# Patient Record
Sex: Female | Born: 1970
Health system: Southern US, Community
[De-identification: ages and names within clinical notes are randomized; demographics above are authoritative.]

## PROBLEM LIST (undated history)

## (undated) DIAGNOSIS — K219 Gastro-esophageal reflux disease without esophagitis: Secondary | ICD-10-CM

## (undated) DIAGNOSIS — J302 Other seasonal allergic rhinitis: Secondary | ICD-10-CM

## (undated) DIAGNOSIS — R202 Paresthesia of skin: Secondary | ICD-10-CM

## (undated) DIAGNOSIS — M549 Dorsalgia, unspecified: Secondary | ICD-10-CM

## (undated) HISTORY — DX: Gastro-esophageal reflux disease without esophagitis: K21.9

## (undated) HISTORY — PX: OTHER SURGICAL HISTORY: SHX169

## (undated) HISTORY — DX: Dorsalgia, unspecified: M54.9

## (undated) HISTORY — DX: Paresthesia of skin: R20.2

## (undated) HISTORY — DX: Other seasonal allergic rhinitis: J30.2

---

## 2003-04-26 ENCOUNTER — Other Ambulatory Visit: Admission: RE | Admit: 2003-04-26 | Discharge: 2003-04-26 | Payer: Self-pay | Admitting: Gynecology

## 2004-06-15 ENCOUNTER — Other Ambulatory Visit: Admission: RE | Admit: 2004-06-15 | Discharge: 2004-06-15 | Payer: Self-pay | Admitting: Obstetrics and Gynecology

## 2004-11-04 ENCOUNTER — Ambulatory Visit: Payer: Self-pay | Admitting: Internal Medicine

## 2004-12-01 ENCOUNTER — Ambulatory Visit: Payer: Self-pay | Admitting: Internal Medicine

## 2005-01-04 ENCOUNTER — Ambulatory Visit: Payer: Self-pay | Admitting: Internal Medicine

## 2005-01-20 ENCOUNTER — Ambulatory Visit: Payer: Self-pay | Admitting: Internal Medicine

## 2005-02-12 ENCOUNTER — Ambulatory Visit: Payer: Self-pay | Admitting: Internal Medicine

## 2005-06-04 ENCOUNTER — Other Ambulatory Visit: Admission: RE | Admit: 2005-06-04 | Discharge: 2005-06-04 | Payer: Self-pay | Admitting: Obstetrics and Gynecology

## 2005-06-07 ENCOUNTER — Ambulatory Visit: Payer: Self-pay | Admitting: Internal Medicine

## 2005-10-27 ENCOUNTER — Ambulatory Visit: Payer: Self-pay | Admitting: Internal Medicine

## 2006-01-03 ENCOUNTER — Ambulatory Visit: Payer: Self-pay | Admitting: Internal Medicine

## 2006-04-26 ENCOUNTER — Ambulatory Visit: Payer: Self-pay | Admitting: Internal Medicine

## 2007-09-19 LAB — CONVERTED CEMR LAB: Pap Smear: NORMAL

## 2008-07-30 ENCOUNTER — Ambulatory Visit: Payer: Self-pay | Admitting: *Deleted

## 2008-07-30 DIAGNOSIS — J309 Allergic rhinitis, unspecified: Secondary | ICD-10-CM | POA: Insufficient documentation

## 2008-07-30 DIAGNOSIS — K219 Gastro-esophageal reflux disease without esophagitis: Secondary | ICD-10-CM

## 2008-07-30 DIAGNOSIS — J45909 Unspecified asthma, uncomplicated: Secondary | ICD-10-CM | POA: Insufficient documentation

## 2008-08-01 ENCOUNTER — Ambulatory Visit: Payer: Self-pay | Admitting: *Deleted

## 2008-08-01 ENCOUNTER — Telehealth (INDEPENDENT_AMBULATORY_CARE_PROVIDER_SITE_OTHER): Payer: Self-pay | Admitting: *Deleted

## 2008-10-10 ENCOUNTER — Telehealth (INDEPENDENT_AMBULATORY_CARE_PROVIDER_SITE_OTHER): Payer: Self-pay | Admitting: *Deleted

## 2009-03-14 ENCOUNTER — Ambulatory Visit: Payer: Self-pay | Admitting: Internal Medicine

## 2009-04-04 ENCOUNTER — Ambulatory Visit: Payer: Self-pay | Admitting: Internal Medicine

## 2009-04-28 ENCOUNTER — Encounter: Payer: Self-pay | Admitting: Internal Medicine

## 2009-09-22 ENCOUNTER — Ambulatory Visit: Payer: Self-pay | Admitting: Family Medicine

## 2009-09-24 ENCOUNTER — Ambulatory Visit: Payer: Self-pay | Admitting: Family Medicine

## 2009-09-25 ENCOUNTER — Encounter: Payer: Self-pay | Admitting: Family Medicine

## 2009-09-26 ENCOUNTER — Encounter: Payer: Self-pay | Admitting: Family Medicine

## 2009-09-30 ENCOUNTER — Encounter: Payer: Self-pay | Admitting: Family Medicine

## 2009-09-30 LAB — CONVERTED CEMR LAB
ALT: 13 units/L (ref 0–35)
AST: 36 units/L (ref 0–37)
Albumin: 4.1 g/dL (ref 3.5–5.2)
Alkaline Phosphatase: 28 units/L — ABNORMAL LOW (ref 39–117)
BUN: 21 mg/dL (ref 6–23)
Basophils Absolute: 0 10*3/uL (ref 0.0–0.1)
Basophils Relative: 0.7 % (ref 0.0–3.0)
Bilirubin, Direct: 0 mg/dL (ref 0.0–0.3)
CO2: 25 meq/L (ref 19–32)
Calcium: 8.8 mg/dL (ref 8.4–10.5)
Chloride: 96 meq/L (ref 96–112)
Cholesterol: 164 mg/dL (ref 0–200)
Creatinine, Ser: 0.8 mg/dL (ref 0.4–1.2)
Eosinophils Absolute: 0.3 10*3/uL (ref 0.0–0.7)
Eosinophils Relative: 3.7 % (ref 0.0–5.0)
GFR calc non Af Amer: 86.17 mL/min (ref >60)
Glucose, Bld: 88 mg/dL (ref 70–99)
HCT: 39.6 % (ref 36.0–46.0)
HDL: 61.9 mg/dL (ref >39.00)
Hemoglobin: 13.6 g/dL (ref 12.0–15.0)
LDL Cholesterol: 88 mg/dL (ref 0–99)
Lymphocytes Relative: 31.7 % (ref 12.0–46.0)
Lymphs Abs: 2.3 10*3/uL (ref 0.7–4.0)
MCHC: 34.3 g/dL (ref 30.0–36.0)
MCV: 94.2 fL (ref 78.0–100.0)
Monocytes Absolute: 0.7 10*3/uL (ref 0.1–1.0)
Monocytes Relative: 10.4 % (ref 3.0–12.0)
Neutro Abs: 3.8 10*3/uL (ref 1.4–7.7)
Neutrophils Relative %: 53.5 % (ref 43.0–77.0)
Platelets: 295 10*3/uL (ref 150.0–400.0)
Potassium: 3.8 meq/L (ref 3.5–5.1)
RBC: 4.2 M/uL (ref 3.87–5.11)
RDW: 11.6 % (ref 11.5–14.6)
Sodium: 133 meq/L — ABNORMAL LOW (ref 135–145)
TSH: 0.81 microintl units/mL (ref 0.35–5.50)
Total Bilirubin: 0.7 mg/dL (ref 0.3–1.2)
Total CHOL/HDL Ratio: 3
Total Protein: 7.1 g/dL (ref 6.0–8.3)
Triglycerides: 70 mg/dL (ref 0.0–149.0)
VLDL: 14 mg/dL (ref 0.0–40.0)
WBC: 7.1 10*3/uL (ref 4.5–10.5)

## 2010-01-21 ENCOUNTER — Ambulatory Visit: Payer: Self-pay | Admitting: Family

## 2010-01-26 ENCOUNTER — Telehealth: Payer: Self-pay | Admitting: Family

## 2010-01-29 ENCOUNTER — Telehealth (INDEPENDENT_AMBULATORY_CARE_PROVIDER_SITE_OTHER): Payer: Self-pay | Admitting: *Deleted

## 2010-02-04 ENCOUNTER — Encounter: Payer: Self-pay | Admitting: Internal Medicine

## 2010-04-03 ENCOUNTER — Ambulatory Visit: Payer: Self-pay | Admitting: Internal Medicine

## 2010-08-17 ENCOUNTER — Ambulatory Visit: Payer: Self-pay | Admitting: Internal Medicine

## 2010-08-17 DIAGNOSIS — R21 Rash and other nonspecific skin eruption: Secondary | ICD-10-CM

## 2010-08-19 ENCOUNTER — Telehealth (INDEPENDENT_AMBULATORY_CARE_PROVIDER_SITE_OTHER): Payer: Self-pay | Admitting: *Deleted

## 2010-12-24 ENCOUNTER — Inpatient Hospital Stay (HOSPITAL_COMMUNITY)
Admission: AD | Admit: 2010-12-24 | Discharge: 2010-12-27 | Payer: Self-pay | Source: Home / Self Care | Attending: Obstetrics and Gynecology | Admitting: Obstetrics and Gynecology

## 2010-12-24 LAB — RPR: RPR Ser Ql: NONREACTIVE

## 2010-12-24 LAB — CBC
HCT: 38.4 % (ref 36.0–46.0)
Hemoglobin: 13.5 g/dL (ref 12.0–15.0)
MCH: 31.8 pg (ref 26.0–34.0)
MCHC: 35.2 g/dL (ref 30.0–36.0)
MCV: 90.4 fL (ref 78.0–100.0)
Platelets: 258 10*3/uL (ref 150–400)
RBC: 4.25 MIL/uL (ref 3.87–5.11)
RDW: 12.5 % (ref 11.5–15.5)
WBC: 16.2 10*3/uL — ABNORMAL HIGH (ref 4.0–10.5)

## 2011-01-04 LAB — CBC
HCT: 33.3 % — ABNORMAL LOW (ref 36.0–46.0)
Hemoglobin: 11.4 g/dL — ABNORMAL LOW (ref 12.0–15.0)
MCH: 31.6 pg (ref 26.0–34.0)
MCHC: 34.2 g/dL (ref 30.0–36.0)
MCV: 92.2 fL (ref 78.0–100.0)
Platelets: 220 10*3/uL (ref 150–400)
RBC: 3.61 MIL/uL — ABNORMAL LOW (ref 3.87–5.11)
RDW: 12.7 % (ref 11.5–15.5)
WBC: 11.9 10*3/uL — ABNORMAL HIGH (ref 4.0–10.5)

## 2011-01-19 NOTE — Progress Notes (Signed)
Summary: wants referral  Phone Note Call from Patient Call back at Home Phone [phone]   Caller: Patient Summary of Call: pt called this AM, rash is no better still has itching and wetness, and hard to sleep. she is trying to keep area dry.  Pt would like a referral to specialist. She is aware NP is out of office today, and will address tomorrow .Kandice Hams  January 29, 2010 9:07 AM  Initial call taken by: Kandice Hams,  January 29, 2010 9:07 AM  Follow-up for Phone Call        Pls call ms wulff and let her know that I am referring her to [facility].  Thanks Follow-up by: Lemont Fillers FNP,  January 30, 2010 8:40 AM  Additional Follow-up for Phone Call Additional follow up Details #1::        Pt has been informed being referred to Dermatology .Kandice Hams  January 30, 2010 9:36 AM  Additional Follow-up by: Kandice Hams,  January 30, 2010 9:36 AM

## 2011-01-19 NOTE — Consult Note (Signed)
Summary: Nita Sells MD Dermatology  Nita Sells MD Dermatology   Imported By: Lanelle Bal 03/09/2010 08:10:11  _____________________________________________________________________  External Attachment:    Type:   Image     Comment:   External Document

## 2011-01-19 NOTE — Progress Notes (Signed)
Summary: rash-2nd call  Phone Note Call from Patient Call back at [facility] Phone [phone]   Details for Reason: Patient left message on VM: Summary of Call: Patient was treated with Nystatin cream on 01/21/10.  Rash is not improving Shary Decamp  January 26, 2010 9:24 AM   Follow-up for Phone Call        Please call patient and ask her to switch to nystatin powder- it may take up to 2 weeks or her symptoms to resolve.  She should come in to be seen if she develops increased pain, swelling/redness or drainage from the area, or if symptoms are not completely resolved in 2 weeks. Follow-up by: Lemont Fillers FNP,  January 26, 2010 12:26 PM  Additional Follow-up for Phone Call Additional follow up Details #1::        Patient called back.  Needs something called in asap.  Please call patient when this is done. Additional Follow-up by: Barnie Mort,  January 26, 2010 3:57 PM    Additional Follow-up for Phone Call Additional follow up Details #2::    Called patient, no answer, left message on machine notifying patient that I have called in a new prescription.  Left instructions for patient to call if her symptoms worsen or if they are not resolved in 2 weeks. Follow-up by: Lemont Fillers FNP,  January 26, 2010 4:43 PM  New/Updated Medications: NYSTATIN  POWD (NYSTATIN) apply three times a day to affected area Prescriptions: NYSTATIN  POWD (NYSTATIN) apply three times a day to affected area  #1 x 0   Entered and Authorized by:   Lemont Fillers FNP   Signed by:   Lemont Fillers FNP on 01/26/2010   Method used:   Electronically to        [facility] [phone]* (retail)       [address]       [place], [place]  [address]       Ph: [phone]       Fax: [phone]   RxID:   [id number]

## 2011-01-19 NOTE — Assessment & Plan Note (Signed)
Summary: ov/swh   Vital Signs:  Patient profile:   40 year old female Height:      69 inches Weight:      147 pounds Pulse rate:   70 / minute BP sitting:   132 / 70  Vitals Entered By: Shary Decamp (April 03, 2010 3:37 PM) CC: discuss irregular menstrual periods   History of Present Illness: 10 days before her last period she spotted which is highly unusual.  This was follow-up by a normal period. Her gynecologist suggested to take 10 days of progesterone but she wonders if she needs to do that.  She feels fine, off birth control because she likes to start a family  Current Medications (verified): 1)  Advair Diskus 250-50 Mcg/dose  Misc (Fluticasone-Salmeterol) .Marland Kitchen.. 1 Puff Twice Daily (Not Using Often) 2)  Proair Hfa 108 (90 Base) Mcg/act Aers (Albuterol Sulfate) .... As Needed 3)  Tylenol .... Prn 4)  Claritin 10 Mg Caps (Loratadine) .Marland Kitchen.. 1 By Mouth Once Daily 5)  Flonase 50 Mcg/act Susp (Fluticasone Propionate) .... 2 Sprays On Each Side of The Nose  Once Daily  Allergies (verified): No Known Drug Allergies  Past History:  Past Medical History: Reviewed history from 03/14/2009 and no changes required. not on BC G0 Asthma GERD seasonal allergies  Past Surgical History: Reviewed history from 07/30/2008 and no changes required. Denies surgical history  Physical Exam  General:  alert and well-developed.   Psych:  Oriented X3, memory intact for recent and remote, normally interactive, good eye contact, not anxious appearing, and not depressed appearing.     Impression & Recommendations:  Problem # 1:  IRREGULAR MENSES (ICD-626.4) her last menstrual period was abnormal in the sense that was preceded by 10 days of spotting she feels fine, no cramps or further abnormality. Recommend observation if further problems, she is to see her gynecologist  Complete Medication List: 1)  Advair Diskus 250-50 Mcg/dose Misc (Fluticasone-salmeterol) .Marland Kitchen.. 1 puff twice daily (not  using often) 2)  Proair Hfa 108 (90 Base) Mcg/act Aers (Albuterol sulfate) .... As needed 3)  Tylenol  .... Prn 4)  Claritin 10 Mg Caps (Loratadine) .Marland Kitchen.. 1 by mouth once daily 5)  Flonase 50 Mcg/act Susp (Fluticasone propionate) .... 2 sprays on each side of the nose  once daily

## 2011-01-19 NOTE — Assessment & Plan Note (Signed)
Summary: rash all over body/kn   Vital Signs:  Patient profile:   40 year old female Weight:      159.25 pounds Temp:     97.5 degrees F oral Pulse rate:   72 / minute Pulse rhythm:   regular BP sitting:   116 / 76  (left arm) Cuff size:   regular  Vitals Entered By: Army Fossa CMA (August 17, 2010 1:53 PM)  CC: Rash all over body Comments On pts stomach, legs, arms Itches very bad Using benadrly Pt is pregnant   History of Present Illness: patient is 5 months pregnant She developed poison ivy last week, her gynecologist prescribed hydrocortisone cream 1% over-the-counter. That particular rash is getting better  3 days ago she developed an itchy rash initially in the chest, breast, abdomen. The rash then spread to the back, neck and extremities The rash in the legs is a slightly better today  ROS  She feels well Denies any fatigue, fever, joint aches. no N-V-D no ST She's not taking any new medicines no tick bites  Current Medications (verified): 1)  Advair Diskus 250-50 Mcg/dose  Misc (Fluticasone-Salmeterol) .Marland Kitchen.. 1 Puff Twice Daily (Not Using Often) 2)  Proair Hfa 108 (90 Base) Mcg/act Aers (Albuterol Sulfate) .... As Needed  Allergies (verified): No Known Drug Allergies  Past History:  Past Medical History: G1 Asthma GERD seasonal allergies  Past Surgical History: Reviewed history from 07/30/2008 and no changes required. Denies surgical history  Social History: Reviewed history from 03/14/2009 and no changes required. Occupation: Runner, broadcasting/film/video married  from [place]   Physical Exam  General:  alert and well-developed.  looks well Mouth:  no red or D/C  Neck:  no LAD Extremities:  no pretibial edema bilaterally  Skin:  she has a macular slightly pink rash  in the  abdomen =chest = back > breast > arm >leg  no scales, no blisters Axillary Nodes:  neg   Impression & Recommendations:  Problem # 1:  EXANTHEM (ICD-782.1)  5 month pregnant  female with 3 day history of exanthema No systemic symptoms Unclear etiology Viral? Plan: Throat culture Continue with Benadryl p.r.n. Will ask dermatology to evaluate the patient, suggestions? Asked patient to call me if anything changed (fever, arthralgias)  Orders: Dermatology Referral (Derma) T-Culture, Throat [phone])  Complete Medication List: 1)  Advair Diskus 250-50 Mcg/dose Misc (Fluticasone-salmeterol) .Marland Kitchen.. 1 puff twice daily (not using often) 2)  Proair Hfa 108 (90 Base) Mcg/act Aers (Albuterol sulfate) .... As needed 3)  O37fa (dha) + Pnv

## 2011-01-19 NOTE — Progress Notes (Signed)
Summary: Pt status  ---- Converted from flag ---- ---- 08/17/2010 6:47 PM, Jose E. Paz MD wrote: please check on the patient, better? ------------------------------  Left message for pt to call back. Army Fossa CMA  August 19, 2010 11:12 AM  Left message for pt to call back. Army Fossa CMA  August 20, 2010 11:02 AM  Pt states that she is doing better. The derm told her it would probably take up to 2 weeks for her to be a 100% better. Army Fossa CMA  August 21, 2010 10:12 AM

## 2011-01-19 NOTE — Assessment & Plan Note (Signed)
Summary: rash- jr   Vital Signs:  Patient profile:   40 year old female Height:      69 inches Weight:      148.2 pounds BMI:     21.96 Temp:     98.5 degrees F oral BP sitting:   114 / 64  (left arm)  Vitals Entered By: Doristine Devoid (January 21, 2010 10:54 AM) CC: rash x2 wks using otc cream w/o improvement lots of itching   Primary Care Provider:  Dr.  Drue Novel  CC:  rash x2 wks using otc cream w/o improvement lots of itching.  History of Present Illness: Ms Kaitlyn Gallagher is a 40 year old female who presents today with c/o rash in gluteal fold.  Started two weeks ago and is itching/pink and wet.  Used desitin without relief.  Now has placed cotton to avoid rubbing.   Also request refills on her inhalers- reports that her asthma is well controlled, rarely needed rescue inhaler.     Allergies: No Known Drug Allergies  Physical Exam  General:  Well-developed,well-nourished,in no acute distress; alert,appropriate and cooperative throughout examination Skin:  1" area pink skin at top of gluteal fold in crease- no associated induration or drainage   Impression & Recommendations:  Problem # 1:  INTERTRIGO, CANDIDAL (NWG-956.21) Assessment New Will treat with nystatin cream.  Patient instructed to keep that area clean and dry.  Call if symptoms not resolved in 1-2 weeks.  Problem # 2:  ASTHMA (ICD-493.90) Assessment: Improved Continue current treatment Her updated medication list for this problem includes:    Advair Diskus 250-50 Mcg/dose Misc (Fluticasone-salmeterol) .Marland Kitchen... 1 puff twice daily (not using often)    Proair Hfa 108 (90 Base) Mcg/act Aers (Albuterol sulfate) .Marland Kitchen... As needed  Complete Medication List: 1)  Advair Diskus 250-50 Mcg/dose Misc (Fluticasone-salmeterol) .Marland Kitchen.. 1 puff twice daily (not using often) 2)  Proair Hfa 108 (90 Base) Mcg/act Aers (Albuterol sulfate) .... As needed 3)  Tylenol  .... Prn 4)  Claritin 10 Mg Caps (Loratadine) .Marland Kitchen.. 1 by mouth once daily 5)   Flonase 50 Mcg/act Susp (Fluticasone propionate) .... 2 sprays on each side of the nose  once daily 6)  Nystatin 100000 Unit/gm Crea (Nystatin) .... Apply twice daily to affected area until healed  Patient Instructions: 1)  Call if your rash does not resolve in 2 weeks. Prescriptions: NYSTATIN 100000 UNIT/GM CREA (NYSTATIN) apply twice daily to affected area until healed  #1 x 0   Entered and Authorized by:   Lemont Fillers FNP   Signed by:   Lemont Fillers FNP on 01/21/2010   Method used:   Electronically to        [facility] [phone]* (retail)       [address]       [place], [place]  [address]       Ph: [phone]       Fax: [phone]   RxID:   [id number] PROAIR HFA 108 (90 BASE) MCG/ACT AERS (ALBUTEROL SULFATE) as needed  #1 x 0   Entered and Authorized by:   Lemont Fillers FNP   Signed by:   Lemont Fillers FNP on 01/21/2010   Method used:   Electronically to        [facility] [phone]* (retail)       [address]       [place], [place]  [address]       Ph: [phone]  Fax: [phone]   RxID:   [id number]

## 2011-04-06 ENCOUNTER — Encounter: Payer: Self-pay | Admitting: Internal Medicine

## 2011-04-06 ENCOUNTER — Ambulatory Visit (INDEPENDENT_AMBULATORY_CARE_PROVIDER_SITE_OTHER): Payer: BC Managed Care – PPO | Admitting: Internal Medicine

## 2011-04-06 DIAGNOSIS — M549 Dorsalgia, unspecified: Secondary | ICD-10-CM

## 2011-04-06 NOTE — Assessment & Plan Note (Signed)
Likely musculoskeletal issue. See instructions

## 2011-04-06 NOTE — Patient Instructions (Signed)
Warm compress Tylenol 500mg  1 or 2 tabs every 6 hours as needed Or Motrin 200mg  3 tabs every 6 hours as needed (watch for stomach irritation) Call if no better in few day

## 2011-04-06 NOTE — Progress Notes (Signed)
  Subjective:    Patient ID: Kaitlyn Gallagher, female    DOB: 1971-06-25, 40 y.o.   MRN: [id number]  HPI Developed right-sided low back pain without radiation yesterday. Symptoms started when she was taking her baby from the crib. Has taken Motrin with some relief. Patient has a 21-month-old baby, she is breast-feeding.  Past Medical History  Diagnosis Date  . Asthma   . GERD (gastroesophageal reflux disease)   . Seasonal allergies    No past surgical history on file.  History   Social History  . Marital Status: Married    Spouse Name: N/A    Number of Children: N/A  . Years of Education: N/A   Occupational History  . Teacher    Social History Main Topics  . Smoking status: Never Smoker   . Smokeless tobacco: Never Used  . Alcohol Use: Not on file  . Drug Use: Not on file  . Sexually Active: Not on file   Other Topics Concern  . Not on file   Social History Narrative   From [place]     Review of Systems No fever or chills No lower extremity paresthesias No bladder or bowel incontinence    Objective:   Physical Exam  Constitutional: She is oriented to person, place, and time. She appears well-developed and well-nourished.  Abdominal: Soft. Bowel sounds are normal. She exhibits no distension. There is no tenderness. There is no rebound and no guarding.  Musculoskeletal:       Not tender to palpation on the lower back. Range of motion of the hips and knees is normal No edema  Neurological: She is oriented to person, place, and time. She displays normal reflexes. She exhibits normal muscle tone.       Negative straight leg test          Assessment & Plan:

## 2011-08-06 ENCOUNTER — Ambulatory Visit (HOSPITAL_COMMUNITY)
Admission: RE | Admit: 2011-08-06 | Discharge: 2011-08-06 | Disposition: A | Discharge status: Home / Self Care | Payer: BC Managed Care – PPO | Source: Ambulatory Visit | Attending: Obstetrics & Gynecology | Admitting: Obstetrics & Gynecology

## 2011-08-06 NOTE — Progress Notes (Signed)
Adult Lactation Consultation Outpatient Visit Note  Patient Name: Kaitlyn Gallagher Date of Birth: July 08, 1971 Gestational Age at Delivery: Unknown Type of Delivery:   Breastfeeding History: Frequency of Breastfeeding:  Length of Feeding:  Voids:  Stools:   Supplementing / Method: Pumping:  Type of Pump:   Frequency:  Volume:    Comments:    Consultation Evaluation:  Initial Feeding Assessment: Pre-feed Weight: Post-feed Weight: Amount Transferred: Comments:  Additional Feeding Assessment: Pre-feed Weight: Post-feed Weight: Amount Transferred: Comments:  Additional Feeding Assessment: Pre-feed Weight: Post-feed Weight: Amount Transferred: Comments:  Total Breast milk Transferred this Visit:  Total Supplement Given:   Additional Interventions:   Follow-Up      Kaitlyn Gallagher 08/06/2011, 9:33 AM    Mom here with questions about her pump- 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 States pump works fine for a few minutes then the suction decreases. Mom applied pump and hissing sound noted with each suck. Changed the tubing and no hissing sound noted. Mom states that feels like better, more consistent suction. Has been pumping once a day. Discussed pumping plans at work. No questions at present. To call if no improvement with pump.

## 2011-08-07 ENCOUNTER — Encounter (HOSPITAL_COMMUNITY)
Admission: RE | Admit: 2011-08-07 | Discharge: 2011-08-07 | Disposition: A | Discharge status: Home / Self Care | Payer: BC Managed Care – PPO | Source: Ambulatory Visit | Attending: Obstetrics and Gynecology | Admitting: Obstetrics and Gynecology

## 2011-08-07 DIAGNOSIS — O923 Agalactia: Secondary | ICD-10-CM | POA: Insufficient documentation

## 2011-09-07 ENCOUNTER — Encounter (HOSPITAL_COMMUNITY)
Admission: RE | Admit: 2011-09-07 | Discharge: 2011-09-07 | Disposition: A | Discharge status: Home / Self Care | Payer: BC Managed Care – PPO | Source: Ambulatory Visit | Attending: Obstetrics and Gynecology | Admitting: Obstetrics and Gynecology

## 2011-09-07 DIAGNOSIS — O923 Agalactia: Secondary | ICD-10-CM | POA: Insufficient documentation

## 2011-10-08 ENCOUNTER — Encounter (HOSPITAL_COMMUNITY)
Admission: RE | Admit: 2011-10-08 | Discharge: 2011-10-08 | Disposition: A | Discharge status: Home / Self Care | Payer: BC Managed Care – PPO | Source: Ambulatory Visit | Attending: Obstetrics and Gynecology | Admitting: Obstetrics and Gynecology

## 2011-10-08 DIAGNOSIS — O923 Agalactia: Secondary | ICD-10-CM | POA: Insufficient documentation

## 2011-11-08 ENCOUNTER — Encounter (HOSPITAL_COMMUNITY)
Admission: RE | Admit: 2011-11-08 | Discharge: 2011-11-08 | Disposition: A | Discharge status: Home / Self Care | Payer: BC Managed Care – PPO | Source: Ambulatory Visit | Attending: Obstetrics and Gynecology | Admitting: Obstetrics and Gynecology

## 2011-11-08 DIAGNOSIS — O923 Agalactia: Secondary | ICD-10-CM | POA: Insufficient documentation

## 2011-12-09 ENCOUNTER — Encounter (HOSPITAL_COMMUNITY)
Admission: RE | Admit: 2011-12-09 | Discharge: 2011-12-09 | Disposition: A | Discharge status: Home / Self Care | Payer: BC Managed Care – PPO | Source: Ambulatory Visit | Attending: Obstetrics and Gynecology | Admitting: Obstetrics and Gynecology

## 2011-12-09 DIAGNOSIS — O923 Agalactia: Secondary | ICD-10-CM | POA: Insufficient documentation

## 2012-01-05 ENCOUNTER — Ambulatory Visit: Payer: BC Managed Care – PPO | Admitting: Internal Medicine

## 2012-01-09 ENCOUNTER — Encounter (HOSPITAL_COMMUNITY)
Admission: RE | Admit: 2012-01-09 | Discharge: 2012-01-09 | Disposition: A | Discharge status: Home / Self Care | Payer: BC Managed Care – PPO | Source: Ambulatory Visit | Attending: Obstetrics and Gynecology | Admitting: Obstetrics and Gynecology

## 2012-01-09 DIAGNOSIS — O923 Agalactia: Secondary | ICD-10-CM | POA: Insufficient documentation

## 2012-03-31 ENCOUNTER — Encounter: Payer: Self-pay | Admitting: Internal Medicine

## 2012-03-31 ENCOUNTER — Ambulatory Visit (INDEPENDENT_AMBULATORY_CARE_PROVIDER_SITE_OTHER): Payer: BC Managed Care – PPO | Admitting: Internal Medicine

## 2012-03-31 VITALS — BP 96/64 | HR 75 | Temp 97.9°F | Wt 153.0 lb

## 2012-03-31 DIAGNOSIS — J309 Allergic rhinitis, unspecified: Secondary | ICD-10-CM

## 2012-03-31 DIAGNOSIS — J45909 Unspecified asthma, uncomplicated: Secondary | ICD-10-CM

## 2012-03-31 DIAGNOSIS — R0609 Other forms of dyspnea: Secondary | ICD-10-CM

## 2012-03-31 LAB — CBC WITH DIFFERENTIAL/PLATELET
Basophils Absolute: 0.1 10*3/uL (ref 0.0–0.1)
Basophils Relative: 0.9 % (ref 0.0–3.0)
Eosinophils Absolute: 0.2 10*3/uL (ref 0.0–0.7)
Eosinophils Relative: 3.6 % (ref 0.0–5.0)
HCT: 41.2 % (ref 36.0–46.0)
Hemoglobin: 13.9 g/dL (ref 12.0–15.0)
Lymphocytes Relative: 32.7 % (ref 12.0–46.0)
Lymphs Abs: 2.3 10*3/uL (ref 0.7–4.0)
MCHC: 33.7 g/dL (ref 30.0–36.0)
MCV: 90.9 fl (ref 78.0–100.0)
Monocytes Absolute: 0.6 10*3/uL (ref 0.1–1.0)
Monocytes Relative: 8.4 % (ref 3.0–12.0)
Neutro Abs: 3.7 10*3/uL (ref 1.4–7.7)
Neutrophils Relative %: 54.4 % (ref 43.0–77.0)
Platelets: 321 10*3/uL (ref 150.0–400.0)
RBC: 4.53 Mil/uL (ref 3.87–5.11)
RDW: 12.3 % (ref 11.5–14.6)
WBC: 6.9 10*3/uL (ref 4.5–10.5)

## 2012-03-31 MED ORDER — ALBUTEROL SULFATE HFA 108 (90 BASE) MCG/ACT IN AERS: 2.0000 | INHALATION_SPRAY | Freq: Four times a day (QID) | RESPIRATORY_TRACT | Status: DC | PRN

## 2012-03-31 MED ORDER — CROMOLYN SODIUM 5.2 MG/ACT NA AERS: 1.0000 | INHALATION_SPRAY | Freq: Four times a day (QID) | NASAL | Status: DC

## 2012-03-31 NOTE — Patient Instructions (Addendum)
Claritin 10 mg over-the-counter once a day. Nasal spray as prescribed. Albuterol only as needed if cough, wheezing. Call if no better in few days , call if you get worse

## 2012-03-31 NOTE — Progress Notes (Signed)
  Subjective:    Patient ID: Kaitlyn Gallagher, female    DOB: [birth year], 41 y.o.   MRN: [id number]  HPI Acute visit Here for the management of allergies. Her allergies were well controlled during her pregnancy but have come back in the last few weeks. She has not been taking any medication because  she is currently breast-feeding. Complains of sneezing, itchy eyes and mild cough. Symptoms are worse when she goes outside.  Past Medical History  Diagnosis Date  . Asthma   . GERD (gastroesophageal reflux disease)   . Seasonal allergies       Review of Systems No actual wheezing but sometimes it is "harder to breathe"  at night and with exertion. Denies lower extremity edema or chest pain. She thinks the difficulty breathing is related to allergies.     Objective:   Physical Exam General -- alert, well-developed, and well-nourished. NAD  Neck --no LADs HEENT -- TMs normal, throat w/o redness, face symmetric and not tender to palpation, nose and eyes normal Lungs -- normal respiratory effort, no intercostal retractions, no accessory muscle use, and normal breath sounds.   Heart-- normal rate, regular rhythm, no murmur, and no gallop.   Extremities-- no pretibial edema bilaterally      Assessment & Plan:

## 2012-03-31 NOTE — Assessment & Plan Note (Signed)
Increase in allergy symptoms, occasionally has noted difficulty breathing, on exam the lungs are clear. She has no chest pain. "Difficulty breathing" maybe related to asthma. Avoid  Inhaler steroids for now because she is breast-feeding, albuterol when necessary; treat allergies. Will check a CBC to be sure the difficulty breathing is not related to anemia. Encouraged to call if she gets worse or is not improving

## 2012-03-31 NOTE — Assessment & Plan Note (Signed)
Increased allergy symptoms in the last few weeks, mostly when she goes outside. We'll treat with Claritin  and a cromolyn nasal spray, both are safe during lactation. Will call if no better.

## 2012-04-03 ENCOUNTER — Encounter: Payer: Self-pay | Admitting: *Deleted

## 2012-04-26 ENCOUNTER — Encounter: Payer: BC Managed Care – PPO | Admitting: Internal Medicine

## 2012-10-13 ENCOUNTER — Encounter: Payer: Self-pay | Admitting: Internal Medicine

## 2012-10-13 ENCOUNTER — Ambulatory Visit (INDEPENDENT_AMBULATORY_CARE_PROVIDER_SITE_OTHER): Payer: BC Managed Care – PPO | Admitting: Internal Medicine

## 2012-10-13 VITALS — BP 104/68 | HR 82 | Temp 98.5°F | Wt 157.0 lb

## 2012-10-13 DIAGNOSIS — R432 Parageusia: Secondary | ICD-10-CM

## 2012-10-13 DIAGNOSIS — M79609 Pain in unspecified limb: Secondary | ICD-10-CM

## 2012-10-13 DIAGNOSIS — R439 Unspecified disturbances of smell and taste: Secondary | ICD-10-CM

## 2012-10-13 MED ORDER — PANTOPRAZOLE SODIUM 40 MG PO TBEC: 40.0000 mg | DELAYED_RELEASE_TABLET | Freq: Every day | ORAL | Status: DC

## 2012-10-13 NOTE — Progress Notes (Signed)
  Subjective:    Patient ID: Kaitlyn Gallagher, female    DOB: 1971/07/11, 41 y.o.   MRN: [id number]  HPI Acute visit 2 weeks history of a different taste in her mouth, dry mouth?. Also, from time to time has sharp, short lived pains in different parts: Elbows, fingers, often times also at the distal  left or right foot.  Past Medical History  Diagnosis Date  . Asthma   . GERD (gastroesophageal reflux disease)   . Seasonal allergies       Review of Systems Denies any dry eyes No actual heartburn  not taking any new medicines Denies actual swelling in the joints or any rash Has not increased her physical activity or start a running program, she does have a 69-year-old daughter and does frequent lifting of course.     Objective:   Physical Exam  General -- alert, well-developed, healthy-appearing and well-nourished.   HEENT -- TMs normal, throat w/o redness, no mucosal dryness or lesions. Extremities-- no pretibial edema bilaterally; inspection and palpation of the elbows, wrists, hands and feet normal Neurologic-- alert & oriented X3 and strength normal in all extremities. Psych-- Cognition and judgment appear intact. Alert and cooperative with normal attention span and concentration.  not anxious appearing and not depressed appearing.      Assessment & Plan:   Dysgeusia , ENT examination normal, related to GERD? Trial with Protonix, if not better she will let me know  Pain in multiple locations, Physical exam is normal, feet pain not classic for plantar fasciitis, could be it stress fracture or Morton's neuroma but the pain is on and off on bilaterally which may those  diagnosis less likely.  recommend observation for now

## 2012-10-13 NOTE — Patient Instructions (Addendum)
Take Protonix before breakfast every day for 4 weeks then as needed Call if you continue having difficulty with the taste. Call if  aches and pains get worse or you see swelling @ the joints.

## 2012-10-16 ENCOUNTER — Ambulatory Visit: Payer: BC Managed Care – PPO | Admitting: Internal Medicine

## 2013-01-01 ENCOUNTER — Ambulatory Visit: Payer: BC Managed Care – PPO | Admitting: Internal Medicine

## 2013-01-01 DIAGNOSIS — Z0289 Encounter for other administrative examinations: Secondary | ICD-10-CM

## 2013-08-24 ENCOUNTER — Encounter: Payer: Self-pay | Admitting: Internal Medicine

## 2013-08-24 ENCOUNTER — Ambulatory Visit (HOSPITAL_BASED_OUTPATIENT_CLINIC_OR_DEPARTMENT_OTHER)
Admission: RE | Admit: 2013-08-24 | Discharge: 2013-08-24 | Disposition: A | Discharge status: Home / Self Care | Payer: BC Managed Care – PPO | Source: Ambulatory Visit | Attending: Internal Medicine | Admitting: Internal Medicine

## 2013-08-24 ENCOUNTER — Ambulatory Visit (INDEPENDENT_AMBULATORY_CARE_PROVIDER_SITE_OTHER): Payer: BC Managed Care – PPO | Admitting: Internal Medicine

## 2013-08-24 VITALS — BP 125/76 | HR 71 | Temp 98.5°F | Wt 157.6 lb

## 2013-08-24 DIAGNOSIS — M549 Dorsalgia, unspecified: Secondary | ICD-10-CM

## 2013-08-24 DIAGNOSIS — M545 Low back pain, unspecified: Secondary | ICD-10-CM | POA: Insufficient documentation

## 2013-08-24 DIAGNOSIS — M5137 Other intervertebral disc degeneration, lumbosacral region: Secondary | ICD-10-CM | POA: Insufficient documentation

## 2013-08-24 DIAGNOSIS — M51379 Other intervertebral disc degeneration, lumbosacral region without mention of lumbar back pain or lower extremity pain: Secondary | ICD-10-CM | POA: Insufficient documentation

## 2013-08-24 NOTE — Assessment & Plan Note (Addendum)
Chronic back pain, mild to moderate . Likely a muscle skeletal issue, doubt a sciatic problem or something similar. Plan is to refer her to physical medicine MD for a more detail exam and hopefuly a Rx of a tailored physical therapy plan.  I think if she improves her core muscle strength she will do better. For completeness we'll check a x-ray. Continue Tylenol Motrin.

## 2013-08-24 NOTE — Patient Instructions (Addendum)
Get the XR at [facility], corner of [address] (10 minutes form here); they are open 24/7 [address]  [place], [place] [address] [phone] --- Warm compress as needed Take  Tylenol  500 mg OTC 2 tabs a day every 8 hours as needed for pain or Motrin 200 mg 2 tablets every 6 hours as needed for pain. Always take it with food because may cause gastritis and ulcers. If you notice nausea, stomach pain, change in the color of stools --->  Stop the medicine and let us know

## 2013-08-24 NOTE — Progress Notes (Signed)
  Subjective:    Patient ID: Kaitlyn Gallagher, female    DOB: 1971/03/12, 42 y.o.   MRN: [id number]  HPI Acute visit Approximately 2 years history of back pain, on and off, could be left or right, lower or upper spine. Symptoms are getting increasingly worse, for the last few months she has a pain in the back every day, sx increase thedays after she tries to exercise like for instance after doing Zumba. No radiation. Takes Motrin or Tylenol with partial relief.  Past Medical History  Diagnosis Date  . Asthma   . GERD (gastroesophageal reflux disease)   . Seasonal allergies    Past Surgical History  Procedure Laterality Date  . No past surgeries     History   Social History  . Marital Status: Married    Spouse Name: N/A    Number of Children: 1  . Years of Education: N/A   Occupational History  . Teacher   .     Social History Main Topics  . Smoking status: Never Smoker   . Smokeless tobacco: Never Used  . Alcohol Use: Yes     Comment: rarely   . Drug Use: Not on file  . Sexual Activity: Not on file   Other Topics Concern  . Not on file   Social History Narrative   From [place]           Review of Systems Denies any lower extremity paresthesias. Not on birth control pills, Pertinent x-ray andpatient states she's not pregnant    Objective:   Physical Exam General -- alert, well-developed, NAD.  Back-neck: Not tender to palpation at the cervical, thoracic or lumbar spine.  Extremities-- no pretibial edema bilaterally  Neurologic-- alert & oriented X3. Speech, gait normal. DTRs symmetric, strength normal in all extremities, Straight leg test neg  Psych-- Cognition and judgment appear intact. Alert and cooperative with normal attention span and concentration. not anxious appearing and not depressed appearing.      Assessment & Plan:

## 2013-08-25 ENCOUNTER — Encounter: Payer: Self-pay | Admitting: Internal Medicine

## 2013-08-28 ENCOUNTER — Encounter: Payer: Self-pay | Admitting: *Deleted

## 2013-08-31 ENCOUNTER — Encounter: Payer: Self-pay | Admitting: Physical Medicine & Rehabilitation

## 2013-09-14 ENCOUNTER — Ambulatory Visit (HOSPITAL_BASED_OUTPATIENT_CLINIC_OR_DEPARTMENT_OTHER): Payer: BC Managed Care – PPO | Admitting: Physical Medicine & Rehabilitation

## 2013-09-14 ENCOUNTER — Encounter: Payer: BC Managed Care – PPO | Attending: Physical Medicine & Rehabilitation

## 2013-09-14 ENCOUNTER — Encounter: Payer: Self-pay | Admitting: Physical Medicine & Rehabilitation

## 2013-09-14 VITALS — BP 113/59 | HR 70 | Resp 14 | Ht 69.0 in | Wt 160.4 lb

## 2013-09-14 DIAGNOSIS — M5137 Other intervertebral disc degeneration, lumbosacral region: Secondary | ICD-10-CM | POA: Insufficient documentation

## 2013-09-14 DIAGNOSIS — M51379 Other intervertebral disc degeneration, lumbosacral region without mention of lumbar back pain or lower extremity pain: Secondary | ICD-10-CM | POA: Insufficient documentation

## 2013-09-14 NOTE — Patient Instructions (Addendum)
Please attend physical therapy and follow a home exercise program prescribed by your therapist  Use Tylenol for pain  May use heating pad  May use ICY hot or therma care patch  Recheck in 6 weeks

## 2013-09-14 NOTE — Progress Notes (Signed)
Subjective:    Patient ID: Kaitlyn Gallagher, female    DOB: March 22, 1971, 42 y.o.   MRN: [id number]  HPI Low back pain since post partum 2012, does not complain of any radiating pain into the hips knees or feet. No falls or injury. No pain during pregnancy. She feels like she has gotten weaker and has not been exercising like she used to. Try doing as above but had pain for several days after words in her low back area. Secondary complaint is neck and upper back pain. No numbness or tingling in the hands. No headaches. ,  No PT tried.  No ASA or Ibuprofen but has GERD, has been using Tylenol on an as needed basis  Pt had flare up of back pain after moving furniture. 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 She states that it was while sitting in small chairs. History of asthma, GERD  Pain Inventory Average Pain 8 Pain Right Now 6 My pain is constant, dull and aching  In the last 24 hours, has pain interfered with the following? General activity 3 Relation with others 0 Enjoyment of life 3 What TIME of day is your pain at its worst? morning and night Sleep (in general) Good  Pain is worse with: bending, sitting and some activites Pain improves with: heat/ice and medication Relief from Meds: 6  Mobility walk without assistance how many minutes can you walk? 120 ability to climb steps?  yes do you drive?  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  no EXAM:  LUMBAR SPINE - COMPLETE 4+ VIEW  COMPARISON: None.  FINDINGS:  Disc space narrowing at L5-S1 compatible with mild degenerative disc  disease. Normal alignment. No fracture. SI joints are symmetric and  unremarkable.  IMPRESSION:  Degenerative disc disease L5-S1.  Electronically Signed   By: Charlett Nose M.D.  On: 08/24/2013 16:08   Physicians involved in your care Any changes since last visit?  no Primary care [facility]   No family history on file. History   Social History  . Marital Status: Married    Spouse Name: N/A    Number of Children: 1  . Years of Education: N/A   Occupational History  . Teacher   .     Social History Main Topics  . Smoking status: Never Smoker   . Smokeless tobacco: Never Used  . Alcohol Use: Yes     Comment: rarely   . Drug Use: None  . Sexual Activity: None   Other Topics Concern  . None   Social History Narrative   From [place]         Past Surgical History  Procedure Laterality Date  . No past surgeries     Past Medical History  Diagnosis Date  . Asthma   . GERD (gastroesophageal reflux disease)   . Seasonal allergies    BP 113/59  Pulse 70  Resp 14  Ht 5\' 9"  (1.753 m)  Wt 160 lb 6.4 oz (72.757 kg)  BMI 23.68 kg/m2  SpO2 98%  LMP 08/19/2013    Review of Systems  Musculoskeletal: Positive for back pain.       Objective:   Physical Exam  Nursing note and vitals reviewed. Constitutional: She is  oriented to person, place, and time. She appears well-developed and well-nourished.  HENT:  Head: Normocephalic and atraumatic.  Eyes: Conjunctivae and EOM are normal. Pupils are equal, round, and reactive to light.  Neck: Normal range of motion.  Musculoskeletal:       Right hip: She exhibits normal range of motion.       Left hip: She exhibits normal range of motion.       Lumbar back: She exhibits normal range of motion.  Negative straight leg raise test  Neurological: She is alert and oriented to person, place, and time. She has normal reflexes.  Psychiatric: She has a normal mood and affect.   Negative femoral stretch test Ambulates without toe drag or knee instability. Sensory exam is normal in bilateral lower extremities     Assessment & Plan:  1. Lumbar degenerative disc L5-S1 without  evidence of radiculopathy. She has had increasing pain after becoming relatively deconditioned. Patient now feels that doing her former exercises is to painful.  Recommend a physical therapy to emphasize core strengthening and transition back into a community-based exercise program  We discussed her films and viewed them. I answered questions about her back. We used a spine model to explain the findings on x-rays. I do not see any indication for MRI at the current time. I do not think she needs any injections for low back pain.  She may continue acetaminophen as needed for pain. Not to exceed 1000 mg 4 times per day. In fact she takes this rarely  She may use heat for low back pain 30 minutes at a time. IC[facility] HOT  patches or therma care patches

## 2013-10-02 ENCOUNTER — Ambulatory Visit: Payer: BC Managed Care – PPO | Attending: Physical Medicine & Rehabilitation | Admitting: Physical Therapy

## 2013-10-02 DIAGNOSIS — IMO0001 Reserved for inherently not codable concepts without codable children: Secondary | ICD-10-CM | POA: Insufficient documentation

## 2013-10-02 DIAGNOSIS — M545 Low back pain, unspecified: Secondary | ICD-10-CM | POA: Insufficient documentation

## 2013-10-09 ENCOUNTER — Ambulatory Visit: Payer: BC Managed Care – PPO | Admitting: Physical Therapy

## 2013-10-25 ENCOUNTER — Ambulatory Visit: Payer: BC Managed Care – PPO | Admitting: Physical Therapy

## 2013-10-26 ENCOUNTER — Encounter: Payer: BC Managed Care – PPO | Attending: Physical Medicine & Rehabilitation

## 2013-10-26 ENCOUNTER — Ambulatory Visit: Payer: BC Managed Care – PPO | Admitting: Physical Medicine & Rehabilitation

## 2013-10-26 DIAGNOSIS — M5137 Other intervertebral disc degeneration, lumbosacral region: Secondary | ICD-10-CM | POA: Insufficient documentation

## 2013-10-26 DIAGNOSIS — M51379 Other intervertebral disc degeneration, lumbosacral region without mention of lumbar back pain or lower extremity pain: Secondary | ICD-10-CM | POA: Insufficient documentation

## 2013-10-30 ENCOUNTER — Encounter: Payer: Self-pay | Admitting: Physical Medicine & Rehabilitation

## 2013-10-30 ENCOUNTER — Ambulatory Visit (HOSPITAL_BASED_OUTPATIENT_CLINIC_OR_DEPARTMENT_OTHER): Payer: BC Managed Care – PPO | Admitting: Physical Medicine & Rehabilitation

## 2013-10-30 VITALS — BP 107/47 | HR 76 | Resp 14 | Ht 69.0 in | Wt 156.0 lb

## 2013-10-30 DIAGNOSIS — M51379 Other intervertebral disc degeneration, lumbosacral region without mention of lumbar back pain or lower extremity pain: Secondary | ICD-10-CM

## 2013-10-30 DIAGNOSIS — M5137 Other intervertebral disc degeneration, lumbosacral region: Secondary | ICD-10-CM

## 2013-10-30 DIAGNOSIS — M533 Sacrococcygeal disorders, not elsewhere classified: Secondary | ICD-10-CM

## 2013-10-30 NOTE — Patient Instructions (Signed)
Please asked to or therapist to show you. exercises to help your sacroiliac joint

## 2013-10-30 NOTE — Progress Notes (Signed)
Subjective:    Patient ID: Kaitlyn Gallagher, female    DOB: 21-Oct-1971, 42 y.o.   MRN: [id number]  HPI Low back pain since post partum 2012, does not complain of any radiating pain into the hips knees or feet. No falls or injury. No pain during pregnancy. She feels like she has gotten weaker and has not been exercising like she used to. Try doing as above but had pain for several days after words in her low back area. No further neck pain  PT for 2 visits. Third visit is scheduled today. Has learned her home exercise program. Overall doing better.  has been using Tylenol on an as needed basis   Pain is down to a 1/10. May go a little higher if she bends and twists such as removing groceries from the back seat of the car  Pain Inventory Average Pain 2 Pain Right Now 1 My pain is n/a  In the last 24 hours, has pain interfered with the following? General activity 0 Relation with others 0 Enjoyment of life 0 What TIME of day is your pain at its worst? morning and evening Sleep (in general) Good  Pain is worse with: bending Pain improves with: heat/ice and therapy/exercise Relief from Meds: 9  Mobility Do you have any goals in this area?  no  Function Do you have any goals in this area?  no  Neuro/Psych No problems in this area  Prior Studies Any changes since last visit?  no  Physicians involved in your care Any changes since last visit?  no   History reviewed. No pertinent family history. History   Social History  . Marital Status: Married    Spouse Name: N/A    Number of Children: 1  . Years of Education: N/A   Occupational History  . Teacher   .     Social History Main Topics  . Smoking status: Never Smoker   . Smokeless tobacco: Never Used  . Alcohol Use: Yes     Comment: rarely   . Drug Use: None  . Sexual Activity: None   Other Topics Concern  . None   Social History Narrative   From [place]         Past Surgical History  Procedure Laterality  Date  . No past surgeries     Past Medical History  Diagnosis Date  . Asthma   . GERD (gastroesophageal reflux disease)   . Seasonal allergies    BP 107/47  Pulse 76  Resp 14  Ht 5\' 9"  (1.753 m)  Wt 156 lb (70.761 kg)  BMI 23.03 kg/m2  SpO2 96%  LMP 10/16/2013     Review of Systems  Musculoskeletal: Positive for back pain.  All other systems reviewed and are negative.       Objective:   Physical Exam Nursing note and vitals reviewed.  Constitutional: She is oriented to person, place, and time. She appears well-developed and well-nourished.  HENT:  Head: Normocephalic and atraumatic.  Eyes: Conjunctivae and EOM are normal. Pupils are equal, round, and reactive to light.  Neck: Normal range of motion.  Musculoskeletal:  Right hip: She exhibits normal range of motion.  Left hip: She exhibits normal range of motion.  Lumbar back: She exhibits normal range of motion.  Negative straight leg raise test  Neurological: She is alert and oriented to person, place, and time. She has normal reflexes.  Psychiatric: She has a normal mood and affect.  Mild tenderness right PSIS  Ambulates without toe drag or knee instability.         Assessment & Plan:  1. Lumbar degenerative disc L5-S1 without evidence of radiculopathy. She has had increasing pain after becoming relatively deconditioned. Patient now feels that doing her former exercises is to painful.  Recommend a physical therapy to emphasize core strengthening and transition back into a community-based exercise program  We discussed her films and viewed them. I answered questions about her back. We used a spine model to explain the findings on x-rays.  I do not see any indication for MRI at the current time.  I do not think she needs any injections for low back pain.  She may continue acetaminophen as needed for pain. Not to exceed 1000 mg 4 times per day. In fact she takes this rarely   2. Patient also may have some  sacroiliac involvement on the right side. Will last therapy to address this with exercise  Return to clinic when necessary Discussed the natural progression of degenerative disc disease as well as potential for radiculopathy. She will call with any exacerbation

## 2013-10-31 ENCOUNTER — Ambulatory Visit: Payer: BC Managed Care – PPO | Attending: Physical Medicine & Rehabilitation | Admitting: Physical Therapy

## 2013-10-31 DIAGNOSIS — IMO0001 Reserved for inherently not codable concepts without codable children: Secondary | ICD-10-CM | POA: Insufficient documentation

## 2013-10-31 DIAGNOSIS — M545 Low back pain, unspecified: Secondary | ICD-10-CM | POA: Insufficient documentation

## 2013-11-29 ENCOUNTER — Telehealth: Payer: Self-pay | Admitting: Internal Medicine

## 2013-11-29 NOTE — Telephone Encounter (Signed)
Patient is calling because her husband ([place], Leighton Parody a pt of Dr. Frederik Pear) came in yesterday with symptoms that afterwards sent him to the ER. Patient states that the doctor at the ER says it is a possibility he might have meningitis, although is lab results are not back yet, and advised that she call us to preventative treatment for bacterial meningitis out of precaution. Please advise what patient needs to do.

## 2013-11-29 NOTE — Telephone Encounter (Signed)
Spoke with the patient, she is asymptomatic, the doctor who saw her husband today advised against antibiotic prophylaxis until the results come back. Plan: Droplet precautions, patient to the ER if she has fever or headaches, she will keep me posted

## 2014-10-21 ENCOUNTER — Encounter: Payer: Self-pay | Admitting: Physical Medicine & Rehabilitation

## 2014-11-05 ENCOUNTER — Ambulatory Visit (INDEPENDENT_AMBULATORY_CARE_PROVIDER_SITE_OTHER): Payer: BC Managed Care – PPO

## 2014-11-05 ENCOUNTER — Encounter: Payer: Self-pay | Admitting: Internal Medicine

## 2014-11-05 ENCOUNTER — Ambulatory Visit (INDEPENDENT_AMBULATORY_CARE_PROVIDER_SITE_OTHER): Payer: BC Managed Care – PPO | Admitting: Internal Medicine

## 2014-11-05 VITALS — BP 105/55 | HR 69 | Temp 98.3°F | Wt 153.5 lb

## 2014-11-05 DIAGNOSIS — M545 Low back pain: Secondary | ICD-10-CM

## 2014-11-05 DIAGNOSIS — L989 Disorder of the skin and subcutaneous tissue, unspecified: Secondary | ICD-10-CM

## 2014-11-05 DIAGNOSIS — J4521 Mild intermittent asthma with (acute) exacerbation: Secondary | ICD-10-CM

## 2014-11-05 DIAGNOSIS — Z23 Encounter for immunization: Secondary | ICD-10-CM

## 2014-11-05 MED ORDER — ALBUTEROL SULFATE HFA 108 (90 BASE) MCG/ACT IN AERS: 2.0000 | INHALATION_SPRAY | Freq: Four times a day (QID) | RESPIRATORY_TRACT | Status: DC | PRN

## 2014-11-05 NOTE — Patient Instructions (Signed)
Please call when you need us to set up the acupunturist referral

## 2014-11-05 NOTE — Progress Notes (Signed)
   Subjective:    Patient ID: Kaitlyn Gallagher, female    DOB: [birth year], 43 y.o.   MRN: [id number]  DOS:  11/05/2014 Type of visit - description : here to discuss the following issues On and off back pain, current treatment is yoga and if she has an exacerbation acupuncture. Needs a referral One-year history of skin lesions at the face, would like them treated. Asthma, currently well controlled, needs a refill on albuterol    Past Medical History  Diagnosis Date  . Asthma   . GERD (gastroesophageal reflux disease)   . Seasonal allergies   . Back pain     Past Surgical History  Procedure Laterality Date  . No past surgeries      History   Social History  . Marital Status: Married    Spouse Name: N/A    Number of Children: 1  . Years of Education: N/A   Occupational History  . Teacher   .     Social History Main Topics  . Smoking status: Never Smoker   . Smokeless tobacco: Never Used  . Alcohol Use: Yes     Comment: rarely   . Drug Use: Not on file  . Sexual Activity: Not on file   Other Topics Concern  . Not on file   Social History Narrative   From [place]              Medication List       This list is accurate as of: 11/05/14  5:47 PM.  Always use your most recent med list.               acetaminophen 325 MG tablet  Commonly known as:  TYLENOL  Take 650 mg by mouth every 6 (six) hours as needed for pain (if needed).     albuterol 108 (90 BASE) MCG/ACT inhaler  Commonly known as:  PROAIR HFA  Inhale 2 puffs into the lungs every 6 (six) hours as needed.     ibuprofen 200 MG tablet  Commonly known as:  ADVIL,MOTRIN  Take 200 mg by mouth as needed.           Objective:   Physical Exam  HENT:  Head:     BP 105/55 mmHg  Pulse 69  Temp(Src) 98.3 F (36.8 C) (Oral)  Wt 153 lb 8 oz (69.627 kg)  SpO2 96%  LMP 09/19/2014 General -- alert, well-developed, NAD.   Extremities-- no pretibial edema bilaterally  Neurologic--  alert &  oriented X3. Speech normal, gait appropriate for age, strength symmetric and appropriate for age.    Psych-- Cognition and judgment appear intact. Cooperative with normal attention span and concentration. No anxious or depressed appearing.       Assessment & Plan:    F2F 15 min coordinating her care

## 2014-11-05 NOTE — Assessment & Plan Note (Signed)
Skin lesions, needs a referral to dermatology, will refer to Dr. Margo AyeHall office, has seen Mrs. Amber before

## 2014-11-05 NOTE — Assessment & Plan Note (Signed)
No symptoms in a while, would like a prescription for albuterol just in case, prescription provided. Flu shot provided as well

## 2014-11-05 NOTE — Assessment & Plan Note (Signed)
On and off back pain, well-controlled with yoga, OTCs, she remains very active. Likes  a referral for an acupuncturist on standby by to be used as needed Referral enter

## 2014-11-05 NOTE — Progress Notes (Signed)
Pre visit review using our clinic review tool, if applicable. No additional management support is needed unless otherwise documented below in the visit note. 

## 2015-11-10 ENCOUNTER — Ambulatory Visit: Payer: BLUE CROSS/BLUE SHIELD | Admitting: Internal Medicine

## 2015-11-10 ENCOUNTER — Telehealth: Payer: Self-pay

## 2015-11-10 NOTE — Telephone Encounter (Signed)
No charge this time. 

## 2015-11-10 NOTE — Telephone Encounter (Signed)
pt showed up for her appt but appt was already canceled since pt left message on voice machine,pt stated does NOT want to be charged NO SHOW/

## 2015-11-10 NOTE — Telephone Encounter (Signed)
Spoke with Drue Novelaz and he also gave me the ok NOT to charge NO SHOW.

## 2015-11-19 ENCOUNTER — Ambulatory Visit: Payer: BLUE CROSS/BLUE SHIELD | Admitting: Internal Medicine

## 2016-02-25 ENCOUNTER — Ambulatory Visit (INDEPENDENT_AMBULATORY_CARE_PROVIDER_SITE_OTHER): Payer: BLUE CROSS/BLUE SHIELD | Admitting: Medical

## 2016-02-25 ENCOUNTER — Encounter: Payer: Self-pay | Admitting: Medical

## 2016-02-25 ENCOUNTER — Other Ambulatory Visit: Payer: Self-pay | Admitting: Medical

## 2016-02-25 ENCOUNTER — Ambulatory Visit (HOSPITAL_BASED_OUTPATIENT_CLINIC_OR_DEPARTMENT_OTHER)
Admission: RE | Admit: 2016-02-25 | Discharge: 2016-02-25 | Disposition: A | Discharge status: Home / Self Care | Payer: BLUE CROSS/BLUE SHIELD | Source: Ambulatory Visit | Attending: Medical | Admitting: Medical

## 2016-02-25 VITALS — BP 100/60 | HR 68 | Temp 98.0°F | Ht 69.0 in | Wt 156.7 lb

## 2016-02-25 DIAGNOSIS — J309 Allergic rhinitis, unspecified: Secondary | ICD-10-CM | POA: Diagnosis not present

## 2016-02-25 DIAGNOSIS — N910 Primary amenorrhea: Secondary | ICD-10-CM | POA: Diagnosis not present

## 2016-02-25 DIAGNOSIS — S46819A Strain of other muscles, fascia and tendons at shoulder and upper arm level, unspecified arm, initial encounter: Secondary | ICD-10-CM | POA: Diagnosis not present

## 2016-02-25 DIAGNOSIS — M50322 Other cervical disc degeneration at C5-C6 level: Secondary | ICD-10-CM | POA: Diagnosis not present

## 2016-02-25 DIAGNOSIS — L853 Xerosis cutis: Secondary | ICD-10-CM

## 2016-02-25 DIAGNOSIS — M542 Cervicalgia: Secondary | ICD-10-CM

## 2016-02-25 DIAGNOSIS — J4520 Mild intermittent asthma, uncomplicated: Secondary | ICD-10-CM | POA: Diagnosis not present

## 2016-02-25 DIAGNOSIS — M4802 Spinal stenosis, cervical region: Secondary | ICD-10-CM | POA: Diagnosis not present

## 2016-02-25 DIAGNOSIS — N926 Irregular menstruation, unspecified: Secondary | ICD-10-CM

## 2016-02-25 LAB — POCT URINE PREGNANCY: Preg Test, Ur: NEGATIVE

## 2016-02-25 MED ORDER — ALBUTEROL SULFATE HFA 108 (90 BASE) MCG/ACT IN AERS: 2.0000 | INHALATION_SPRAY | Freq: Four times a day (QID) | RESPIRATORY_TRACT | Status: DC | PRN

## 2016-02-25 MED ORDER — FLUTICASONE PROPIONATE 50 MCG/ACT NA SUSP: 2.0000 | Freq: Every day | NASAL | Status: DC

## 2016-02-25 NOTE — Progress Notes (Signed)
Subjective:    Patient ID: Kaitlyn Gallagher, female    DOB: November 23, 1971, 45 y.o.   MRN: [id number]  HPI    Pt in with some neck pain in upper neck. Pt notices it when driving and sometimes can here crepitus type sound.  Faint discomfort at best. If has to give number 2-3/10.  This has been going on for about a week.  No radiating pain to her arms.  Pt reports maybe some stress.    Pt also states in past has used inhalers. She states in [place] usually in spring will rarely [place] inhalers.Faint sob recently but no wheeze. No leg pain.   Pt has been sneezing and some itching throat. Pt sometimes uses claritin. No nasal sprays.  Occasionally feels dry lower eye lid.    Review of Systems  Constitutional: Negative for fever, chills and fatigue.  HENT: Positive for congestion, sneezing and sore throat.        Only itchy throat.  Respiratory: Negative for cough, chest tightness, shortness of breath and wheezing.   Cardiovascular: Negative for chest pain and palpitations.  Gastrointestinal: Negative for abdominal pain.  Musculoskeletal: Negative for back pain.  Skin:       Mild dry skin to lower eye lids.  Neurological: Negative for dizziness and headaches.  Hematological: Negative for adenopathy. Does not bruise/bleed easily.  Psychiatric/Behavioral: Negative for behavioral problems and confusion.    Past Medical History  Diagnosis Date  . Asthma   . GERD (gastroesophageal reflux disease)   . Seasonal allergies   . Back pain     Social History   Social History  . Marital Status: Married    Spouse Name: N/A  . Number of Children: 1  . Years of Education: N/A   Occupational History  . Teacher   .     Social History Main Topics  . Smoking status: Never Smoker   . Smokeless tobacco: Never Used  . Alcohol Use: Yes     Comment: rarely   . Drug Use: Not on file  . Sexual Activity: Not on file   Other Topics Concern  . Not on file   Social History Narrative   From  [place]          Past Surgical History  Procedure Laterality Date  . No past surgeries      No family history on file.  No Known Allergies  Current Outpatient Prescriptions on File Prior to Visit  Medication Sig Dispense Refill  . acetaminophen (TYLENOL) 325 MG tablet Take 650 mg by mouth every 6 (six) hours as needed for pain (if needed).    Marland Kitchen albuterol (PROAIR HFA) 108 (90 BASE) MCG/ACT inhaler Inhale 2 puffs into the lungs every 6 (six) hours as needed. 1 Inhaler 1  . ibuprofen (ADVIL,MOTRIN) 200 MG tablet Take 200 mg by mouth as needed.     No current facility-administered medications on file prior to visit.    BP 100/60 mmHg  Pulse 68  Temp(Src) 98 F (36.7 C) (Oral)  Ht  (1.753 m)  Wt 156 lb 11.2 oz (71.079 kg)  BMI 23.13 kg/m2  SpO2 97%       Objective:   Physical Exam  General- No acute distress. Pleasant patient. Neck- Full range of motion, no jvd. No c-spine pain faint trapezius tender at insertion at best.   HEENT Head- Normal. Ear Auditory Canal - Left- Normal. Right - Normal.Tympanic Membrane- Left- Normal. Right- Normal. Eye Sclera/Conjunctiva- Left- Normal. Right-  Normal.(but allergic shiner appearance. Faint dry appearance to eye lid) Nose & Sinuses Nasal Mucosa- Left-  Boggy and Congested. Right-  Boggy and  Congested.Bilateral no  maxillary and no  frontal sinus pressure. Mouth & Throat Lips: Upper Lip- Normal: no dryness, cracking, pallor, cyanosis, or vesicular eruption. Lower Lip-Normal: no dryness, cracking, pallor, cyanosis or vesicular eruption. Buccal Mucosa- Bilateral- No Aphthous ulcers. Oropharynx- No Discharge or Erythema. Tonsils: Characteristics- Bilateral- No Erythema or Congestion. Size/Enlargement- Bilateral- No enlargement. Discharge- bilateral-None.  Chest and Lung Exam Auscultation: Breath Sounds:-Clear even and unlabored.  Cardiovascular Auscultation:Rythm- Regular, rate and rhythm. Murmurs & Other Heart  Sounds:Ausculatation of the heart reveal- No Murmurs.  Lymphatic Head & Neck General Head & Neck Lymphatics: Bilateral: Description- No Localized lymphadenopathy.  Neurologic- CNII- XII grossly intact.  Lower ext- no edema, calfs symmetic, negative homans signs.      Assessment & Plan:  For neck pain. Use alleve and trapezius massage. Get cspine xray today. If pain worsens or changes notify us.  For hx of rare wheezing. Refill albuterol  For allergies stay on claritin. Rx flonase.  For dry skin use otc moisturize to lower lids. Could also some try vitamin e to lower lid. But be careflul not to put in lash area.  Follow up in 2 wks or as needed

## 2016-02-25 NOTE — Progress Notes (Signed)
Pre visit review using our clinic review tool, if applicable. No additional management support is needed unless otherwise documented below in the visit note. 

## 2016-02-25 NOTE — Patient Instructions (Addendum)
For neck pain. Use alleve and trapezius massage. Get cspine xray today. If pain worsens or changes notify us.  For hx of rare wheezing. Refill albuterol  For allergies stay on claritin. Rx flonase.  For dry skin use otc moisturize to lower lids. Could also some try vitamin e  Lotion or oil to lower lid. But be careflul not to put in lash area.  Follow up in 2 wks or as needed

## 2017-10-19 DIAGNOSIS — Z6823 Body mass index (BMI) 23.0-23.9, adult: Secondary | ICD-10-CM | POA: Diagnosis not present

## 2017-10-19 DIAGNOSIS — Z1231 Encounter for screening mammogram for malignant neoplasm of breast: Secondary | ICD-10-CM | POA: Diagnosis not present

## 2017-10-19 DIAGNOSIS — Z01419 Encounter for gynecological examination (general) (routine) without abnormal findings: Secondary | ICD-10-CM | POA: Diagnosis not present

## 2017-10-20 LAB — HM PAP SMEAR

## 2017-10-31 DIAGNOSIS — Z1321 Encounter for screening for nutritional disorder: Secondary | ICD-10-CM | POA: Diagnosis not present

## 2017-10-31 DIAGNOSIS — Z13220 Encounter for screening for lipoid disorders: Secondary | ICD-10-CM | POA: Diagnosis not present

## 2017-10-31 DIAGNOSIS — Z131 Encounter for screening for diabetes mellitus: Secondary | ICD-10-CM | POA: Diagnosis not present

## 2017-10-31 DIAGNOSIS — Z1329 Encounter for screening for other suspected endocrine disorder: Secondary | ICD-10-CM | POA: Diagnosis not present

## 2017-11-23 DIAGNOSIS — Z803 Family history of malignant neoplasm of breast: Secondary | ICD-10-CM | POA: Diagnosis not present

## 2017-11-23 DIAGNOSIS — Z800 Family history of malignant neoplasm of digestive organs: Secondary | ICD-10-CM | POA: Diagnosis not present

## 2017-12-20 HISTORY — PX: BREAST BIOPSY: SHX20

## 2018-01-11 DIAGNOSIS — Z809 Family history of malignant neoplasm, unspecified: Secondary | ICD-10-CM | POA: Diagnosis not present

## 2018-01-14 ENCOUNTER — Other Ambulatory Visit: Payer: Self-pay | Admitting: Obstetrics and Gynecology

## 2018-01-14 DIAGNOSIS — Z803 Family history of malignant neoplasm of breast: Secondary | ICD-10-CM

## 2018-01-26 ENCOUNTER — Ambulatory Visit
Admission: RE | Admit: 2018-01-26 | Discharge: 2018-01-26 | Disposition: A | Discharge status: Home / Self Care | Payer: BLUE CROSS/BLUE SHIELD | Source: Ambulatory Visit | Attending: Obstetrics and Gynecology | Admitting: Obstetrics and Gynecology

## 2018-01-26 DIAGNOSIS — Z803 Family history of malignant neoplasm of breast: Secondary | ICD-10-CM

## 2018-01-26 DIAGNOSIS — Z833 Family history of diabetes mellitus: Secondary | ICD-10-CM | POA: Diagnosis not present

## 2018-01-26 MED ORDER — GADOBENATE DIMEGLUMINE 529 MG/ML IV SOLN
14.0000 mL | Freq: Once | INTRAVENOUS | Status: AC | PRN
  Administered 2018-01-26: 14 mL via INTRAVENOUS

## 2018-02-02 ENCOUNTER — Other Ambulatory Visit: Payer: Self-pay | Admitting: Obstetrics and Gynecology

## 2018-02-02 DIAGNOSIS — N63 Unspecified lump in unspecified breast: Secondary | ICD-10-CM

## 2018-02-08 ENCOUNTER — Other Ambulatory Visit: Payer: Self-pay | Admitting: Obstetrics and Gynecology

## 2018-02-08 ENCOUNTER — Ambulatory Visit
Admission: RE | Admit: 2018-02-08 | Discharge: 2018-02-08 | Disposition: A | Discharge status: Home / Self Care | Payer: BLUE CROSS/BLUE SHIELD | Source: Ambulatory Visit | Attending: Obstetrics and Gynecology | Admitting: Obstetrics and Gynecology

## 2018-02-08 DIAGNOSIS — N63 Unspecified lump in unspecified breast: Secondary | ICD-10-CM

## 2018-02-08 DIAGNOSIS — N6313 Unspecified lump in the right breast, lower outer quadrant: Secondary | ICD-10-CM | POA: Diagnosis not present

## 2018-02-08 DIAGNOSIS — D241 Benign neoplasm of right breast: Secondary | ICD-10-CM | POA: Diagnosis not present

## 2018-02-08 DIAGNOSIS — N6314 Unspecified lump in the right breast, lower inner quadrant: Secondary | ICD-10-CM | POA: Diagnosis not present

## 2018-02-13 ENCOUNTER — Telehealth: Payer: Self-pay | Admitting: *Deleted

## 2018-02-13 NOTE — Telephone Encounter (Signed)
Received Dermatopathology Report results from [facility]; forwarded to provider/SLS 02/25    

## 2018-04-17 DIAGNOSIS — M67442 Ganglion, left hand: Secondary | ICD-10-CM | POA: Diagnosis not present

## 2018-04-17 DIAGNOSIS — M25522 Pain in left elbow: Secondary | ICD-10-CM | POA: Diagnosis not present

## 2018-04-17 DIAGNOSIS — M25532 Pain in left wrist: Secondary | ICD-10-CM | POA: Diagnosis not present

## 2018-04-17 DIAGNOSIS — M25521 Pain in right elbow: Secondary | ICD-10-CM | POA: Diagnosis not present

## 2018-04-17 DIAGNOSIS — M7701 Medial epicondylitis, right elbow: Secondary | ICD-10-CM | POA: Diagnosis not present

## 2018-06-26 ENCOUNTER — Encounter: Payer: Self-pay | Admitting: Internal Medicine

## 2018-06-26 ENCOUNTER — Ambulatory Visit (INDEPENDENT_AMBULATORY_CARE_PROVIDER_SITE_OTHER): Payer: BLUE CROSS/BLUE SHIELD | Admitting: Internal Medicine

## 2018-06-26 VITALS — BP 116/74 | HR 84 | Temp 98.1°F | Resp 16 | Ht 69.0 in | Wt 161.4 lb

## 2018-06-26 DIAGNOSIS — Z114 Encounter for screening for human immunodeficiency virus [HIV]: Secondary | ICD-10-CM

## 2018-06-26 DIAGNOSIS — Z23 Encounter for immunization: Secondary | ICD-10-CM | POA: Diagnosis not present

## 2018-06-26 DIAGNOSIS — Z0000 Encounter for general adult medical examination without abnormal findings: Secondary | ICD-10-CM | POA: Diagnosis not present

## 2018-06-26 NOTE — Progress Notes (Signed)
Subjective:    Patient ID: Kaitlyn Gallagher, female    DOB: 1971-04-04, 47 y.o.   MRN: [id number]  DOS:  06/26/2018 Type of visit - description : cpx Interval history: Here for CPX   Review of Systems Has right elbow pain, saw orthopedic surgery approximately 4 months ago, not completely back to normal Has a number of moles that she can check because they  are on her back. Occasional headaches, mild, usually related to her periods or ovulation but sometimes no relationship.  No recent headache has been "the worst of her life".  No associated nausea.   Other than above, a 14 point review of systems is negative      Past Medical History:  Diagnosis Date  . Asthma   . Back pain   . GERD (gastroesophageal reflux disease)   . Seasonal allergies     Past Surgical History:  Procedure Laterality Date  . uterine polyp removal       Social History   Socioeconomic History  . Marital status: Married    Spouse name: Not on file  . Number of children: 1  . Years of education: Not on file  . Highest education level: Not on file  Occupational History  . Occupation: Magazine features editor: [employer]  Social Needs  . Financial resource strain: Not on file  . Food insecurity:    Worry: Not on file    Inability: Not on file  . Transportation needs:    Medical: Not on file    Non-medical: Not on file  Tobacco Use  . Smoking status: Never Smoker  . Smokeless tobacco: Never Used  Substance and Sexual Activity  . Alcohol use: Yes    Comment: rarely   . Drug use: Not on file  . Sexual activity: Not on file  Lifestyle  . Physical activity:    Days per week: Not on file    Minutes per session: Not on file  . Stress: Not on file  Relationships  . Social connections:    Talks on phone: Not on file    Gets together: Not on file    Attends religious service: Not on file    Active member of club or organization: Not on file    Attends meetings of clubs or organizations: Not  on file    Relationship status: Not on file  . Intimate partner violence:    Fear of current or ex partner: Not on file    Emotionally abused: Not on file    Physically abused: Not on file    Forced sexual activity: Not on file  Other Topics Concern  . Not on file  Social History Narrative   From [place]   [place]         Family History  Problem Relation Age of Onset  . Osteoarthritis Mother   . Colon cancer Father        1  . Breast cancer Paternal Aunt   . Breast cancer Paternal Aunt      Allergies as of 06/26/2018      Reactions   Gadolinium Derivatives Hives   Pt complained at end of study about itching on left side at waistline. Only one visible hive, pt was seen by dr Mosetta Putt. He felt since so slight, no benadryl given, pt stayed at office for extra 20 minutes to be checked.      Medication List        Accurate as  of 06/26/18 11:59 PM. Always use your most recent med list.          acetaminophen 325 MG tablet Commonly known as:  TYLENOL Take 650 mg by mouth every 6 (six) hours as needed for pain (if needed).   albuterol 108 (90 Base) MCG/ACT inhaler Commonly known as:  PROAIR HFA Inhale 2 puffs into the lungs every 6 (six) hours as needed.          Objective:   Physical Exam BP 116/74 (BP Location: Left Arm, Patient Position: Sitting, Cuff Size: Small)   Pulse 84   Temp 98.1 F (36.7 C) (Oral)   Resp 16   Ht 5\' 9"  (1.753 m)   Wt 161 lb 6 oz (73.2 kg)   LMP 06/12/2018 (Approximate)   SpO2 98%   BMI 23.83 kg/m  General: Well developed, NAD, see BMI.  Neck: No  thyromegaly  HEENT:  Normocephalic . Face symmetric, atraumatic Lungs:  CTA B Normal respiratory effort, no intercostal retractions, no accessory muscle use. Heart: RRR,  no murmur.  No pretibial edema bilaterally  Abdomen:  Not distended, soft, non-tender. No rebound or rigidity.   Skin: Exposed areas without rash. Not pale. Not jaundice Neurologic:  alert & oriented X3.  Speech  normal, gait appropriate for age and unassisted Strength symmetric and appropriate for age.  Psych: Cognition and judgment appear intact.  Cooperative with normal attention span and concentration.  Behavior appropriate. No anxious or depressed appearing.     Assessment & Plan:   Assessment Allergic rhinitis GERD (h/o GI eval ~ 2005, patient reports she was diagnosed with dysmotility?) History of asthma  Plan: GERD: Basically no symptoms lately. Asthma: Has a inhaler, has using one time over the last year in the context of having allergies, pollen exposure and are mild shortness of breath. SOB responded to albuterol inhaler immediately. Moles: We agreed she will see dermatology, will call if a referral is needed Right elbow pain: Rec to see Ortho again if needed Headaches: As described above, recommend observation, definitely call if the headaches get more intense, frequent or different RTC 1 year

## 2018-06-26 NOTE — Patient Instructions (Signed)
  GO TO THE FRONT DESK Schedule labs to be done this week, fasting  Schedule your next appointment for a physical exam next year  Please see your dermatologist  Please see your eye doctor for yearly eye checkup.

## 2018-06-26 NOTE — Assessment & Plan Note (Signed)
-  Td: 06/2018 - sees gyn regularly.  D/t FH breast ca reports she had a genetic testing and was told she is high risk for breast ca, Rx a MRI-MMG q year. No mention of colon ca risk.  -CCS: + FH colon ca, father age at age 45.  Recommend to start with Hemoccults. -We will come back fasting for labs : CMP, FLP, CBC, TSH, HIV, vitamin D, B12, folic acid -Diet and exercise discussed

## 2018-06-26 NOTE — Progress Notes (Signed)
Pre visit review using our clinic review tool, if applicable. No additional management support is needed unless otherwise documented below in the visit note. 

## 2018-06-27 ENCOUNTER — Other Ambulatory Visit (INDEPENDENT_AMBULATORY_CARE_PROVIDER_SITE_OTHER): Payer: BLUE CROSS/BLUE SHIELD

## 2018-06-27 DIAGNOSIS — Z114 Encounter for screening for human immunodeficiency virus [HIV]: Secondary | ICD-10-CM | POA: Diagnosis not present

## 2018-06-27 DIAGNOSIS — N930 Postcoital and contact bleeding: Secondary | ICD-10-CM | POA: Diagnosis not present

## 2018-06-27 DIAGNOSIS — Z0000 Encounter for general adult medical examination without abnormal findings: Secondary | ICD-10-CM | POA: Diagnosis not present

## 2018-06-27 DIAGNOSIS — Z09 Encounter for follow-up examination after completed treatment for conditions other than malignant neoplasm: Secondary | ICD-10-CM | POA: Insufficient documentation

## 2018-06-27 LAB — COMPREHENSIVE METABOLIC PANEL
ALBUMIN: 4.3 g/dL (ref 3.5–5.2)
ALT: 8 U/L (ref 0–35)
AST: 18 U/L (ref 0–37)
Alkaline Phosphatase: 37 U/L — ABNORMAL LOW (ref 39–117)
BUN: 13 mg/dL (ref 6–23)
CALCIUM: 9.5 mg/dL (ref 8.4–10.5)
CHLORIDE: 100 meq/L (ref 96–112)
CO2: 32 mEq/L (ref 19–32)
Creatinine, Ser: 0.74 mg/dL (ref 0.40–1.20)
GFR: 90.22 mL/min (ref >60.00)
Glucose, Bld: 100 mg/dL — ABNORMAL HIGH (ref 70–99)
POTASSIUM: 5 meq/L (ref 3.5–5.1)
Sodium: 137 mEq/L (ref 135–145)
TOTAL PROTEIN: 7.1 g/dL (ref 6.0–8.3)
Total Bilirubin: 0.5 mg/dL (ref 0.2–1.2)

## 2018-06-27 LAB — CBC WITH DIFFERENTIAL/PLATELET
BASOS ABS: 0.1 10*3/uL (ref 0.0–0.1)
Basophils Relative: 1.1 % (ref 0.0–3.0)
Eosinophils Absolute: 0.2 10*3/uL (ref 0.0–0.7)
Eosinophils Relative: 2.2 % (ref 0.0–5.0)
HCT: 41 % (ref 36.0–46.0)
Hemoglobin: 14.2 g/dL (ref 12.0–15.0)
LYMPHS ABS: 2 10*3/uL (ref 0.7–4.0)
LYMPHS PCT: 24.4 % (ref 12.0–46.0)
MCHC: 34.6 g/dL (ref 30.0–36.0)
MCV: 90.6 fl (ref 78.0–100.0)
MONOS PCT: 7.5 % (ref 3.0–12.0)
Monocytes Absolute: 0.6 10*3/uL (ref 0.1–1.0)
NEUTROS PCT: 64.8 % (ref 43.0–77.0)
Neutro Abs: 5.3 10*3/uL (ref 1.4–7.7)
Platelets: 327 10*3/uL (ref 150.0–400.0)
RBC: 4.52 Mil/uL (ref 3.87–5.11)
RDW: 12.4 % (ref 11.5–15.5)
WBC: 8.1 10*3/uL (ref 4.0–10.5)

## 2018-06-27 LAB — LIPID PANEL
Cholesterol: 180 mg/dL (ref 0–200)
HDL: 66.7 mg/dL (ref >39.00)
LDL Cholesterol: 95 mg/dL (ref 0–99)
NONHDL: 113.27
TRIGLYCERIDES: 93 mg/dL (ref 0.0–149.0)
Total CHOL/HDL Ratio: 3
VLDL: 18.6 mg/dL (ref 0.0–40.0)

## 2018-06-27 LAB — TSH: TSH: 0.92 u[IU]/mL (ref 0.35–4.50)

## 2018-06-27 LAB — B12 AND FOLATE PANEL
FOLATE: 16 ng/mL (ref >5.9)
Vitamin B-12: 656 pg/mL (ref 211–911)

## 2018-06-27 NOTE — Assessment & Plan Note (Signed)
Plan: GERD: Basically no symptoms lately. Asthma: Has a inhaler, has using one time over the last year in the context of having allergies, pollen exposure and are mild shortness of breath. SOB responded to albuterol inhaler immediately. Moles: We agreed she will see dermatology, will call if a referral is needed Right elbow pain: Rec to see Ortho again if needed Headaches: As described above, recommend observation, definitely call if the headaches get more intense, frequent or different RTC 1 year

## 2018-06-29 LAB — VITAMIN D 1,25 DIHYDROXY
VITAMIN D 1, 25 (OH) TOTAL: 57 pg/mL (ref 18–72)
Vitamin D2 1, 25 (OH)2: <8 pg/mL
Vitamin D3 1, 25 (OH)2: 57 pg/mL

## 2018-06-29 LAB — HIV ANTIBODY (ROUTINE TESTING W REFLEX): HIV 1&2 Ab, 4th Generation: NONREACTIVE

## 2018-07-10 ENCOUNTER — Other Ambulatory Visit (INDEPENDENT_AMBULATORY_CARE_PROVIDER_SITE_OTHER): Payer: BLUE CROSS/BLUE SHIELD

## 2018-07-10 DIAGNOSIS — Z0000 Encounter for general adult medical examination without abnormal findings: Secondary | ICD-10-CM

## 2018-07-10 LAB — FECAL OCCULT BLOOD, IMMUNOCHEMICAL: Fecal Occult Bld: NEGATIVE

## 2018-11-29 DIAGNOSIS — Z01419 Encounter for gynecological examination (general) (routine) without abnormal findings: Secondary | ICD-10-CM | POA: Diagnosis not present

## 2018-11-29 DIAGNOSIS — Z6823 Body mass index (BMI) 23.0-23.9, adult: Secondary | ICD-10-CM | POA: Diagnosis not present

## 2019-01-08 DIAGNOSIS — Z1231 Encounter for screening mammogram for malignant neoplasm of breast: Secondary | ICD-10-CM | POA: Diagnosis not present

## 2019-01-08 LAB — HM MAMMOGRAPHY

## 2019-01-12 ENCOUNTER — Other Ambulatory Visit: Payer: Self-pay | Admitting: Obstetrics and Gynecology

## 2019-01-12 DIAGNOSIS — Z803 Family history of malignant neoplasm of breast: Secondary | ICD-10-CM

## 2019-01-25 ENCOUNTER — Ambulatory Visit
Admission: RE | Admit: 2019-01-25 | Discharge: 2019-01-25 | Disposition: A | Discharge status: Home / Self Care | Payer: BLUE CROSS/BLUE SHIELD | Source: Ambulatory Visit | Attending: Obstetrics and Gynecology | Admitting: Obstetrics and Gynecology

## 2019-01-25 DIAGNOSIS — Z803 Family history of malignant neoplasm of breast: Secondary | ICD-10-CM

## 2019-01-25 DIAGNOSIS — Z853 Personal history of malignant neoplasm of breast: Secondary | ICD-10-CM | POA: Diagnosis not present

## 2019-01-25 MED ORDER — GADOBUTROL 1 MMOL/ML IV SOLN
8.0000 mL | Freq: Once | INTRAVENOUS | Status: AC | PRN
  Administered 2019-01-25: 8 mL via INTRAVENOUS

## 2019-05-29 DIAGNOSIS — Z03818 Encounter for observation for suspected exposure to other biological agents ruled out: Secondary | ICD-10-CM | POA: Diagnosis not present

## 2019-06-27 ENCOUNTER — Encounter: Payer: Self-pay | Admitting: Internal Medicine

## 2019-06-28 ENCOUNTER — Ambulatory Visit (INDEPENDENT_AMBULATORY_CARE_PROVIDER_SITE_OTHER): Payer: BC Managed Care – PPO | Admitting: Internal Medicine

## 2019-06-28 ENCOUNTER — Other Ambulatory Visit: Payer: Self-pay

## 2019-06-28 ENCOUNTER — Encounter: Payer: Self-pay | Admitting: Internal Medicine

## 2019-06-28 VITALS — BP 99/64 | HR 64 | Temp 98.1°F | Resp 16 | Ht 69.0 in | Wt 158.4 lb

## 2019-06-28 DIAGNOSIS — Z0000 Encounter for general adult medical examination without abnormal findings: Secondary | ICD-10-CM | POA: Diagnosis not present

## 2019-06-28 DIAGNOSIS — Z800 Family history of malignant neoplasm of digestive organs: Secondary | ICD-10-CM

## 2019-06-28 LAB — CBC WITH DIFFERENTIAL/PLATELET
Basophils Absolute: 0.1 10*3/uL (ref 0.0–0.1)
Basophils Relative: 0.7 % (ref 0.0–3.0)
Eosinophils Absolute: 0.5 10*3/uL (ref 0.0–0.7)
Eosinophils Relative: 6.2 % — ABNORMAL HIGH (ref 0.0–5.0)
HCT: 42.4 % (ref 36.0–46.0)
Hemoglobin: 14.6 g/dL (ref 12.0–15.0)
Lymphocytes Relative: 27.2 % (ref 12.0–46.0)
Lymphs Abs: 2.1 10*3/uL (ref 0.7–4.0)
MCHC: 34.3 g/dL (ref 30.0–36.0)
MCV: 90.4 fl (ref 78.0–100.0)
Monocytes Absolute: 0.6 10*3/uL (ref 0.1–1.0)
Monocytes Relative: 8.1 % (ref 3.0–12.0)
Neutro Abs: 4.6 10*3/uL (ref 1.4–7.7)
Neutrophils Relative %: 57.8 % (ref 43.0–77.0)
Platelets: 327 10*3/uL (ref 150.0–400.0)
RBC: 4.7 Mil/uL (ref 3.87–5.11)
RDW: 12.4 % (ref 11.5–15.5)
WBC: 7.9 10*3/uL (ref 4.0–10.5)

## 2019-06-28 LAB — BASIC METABOLIC PANEL
BUN: 15 mg/dL (ref 6–23)
CO2: 27 mEq/L (ref 19–32)
Calcium: 9 mg/dL (ref 8.4–10.5)
Chloride: 102 mEq/L (ref 96–112)
Creatinine, Ser: 0.83 mg/dL (ref 0.40–1.20)
GFR: 74.02 mL/min (ref >60.00)
Glucose, Bld: 92 mg/dL (ref 70–99)
Potassium: 4.3 mEq/L (ref 3.5–5.1)
Sodium: 136 mEq/L (ref 135–145)

## 2019-06-28 LAB — LIPID PANEL
Cholesterol: 172 mg/dL (ref 0–200)
HDL: 61.9 mg/dL (ref >39.00)
LDL Cholesterol: 97 mg/dL (ref 0–99)
NonHDL: 109.81
Total CHOL/HDL Ratio: 3
Triglycerides: 64 mg/dL (ref 0.0–149.0)
VLDL: 12.8 mg/dL (ref 0.0–40.0)

## 2019-06-28 MED ORDER — PREDNISOLONE ACETATE 1 % OP SUSP: 1.0000 [drp] | Freq: Three times a day (TID) | OPHTHALMIC | 0 refills | Status: DC

## 2019-06-28 NOTE — Progress Notes (Signed)
Subjective:    Patient ID: Kaitlyn Gallagher, female    DOB: 1971/08/16, 48 y.o.   MRN: [id number]  DOS:  06/28/2019 Type of visit - description: cpx She reports several symptoms: Had neck pain few weeks ago, that is resolved but now she has on and off numbness at the right thumb and second finger..  Usually in the morning but at times on and off throughout the day.  Has a area of irritation at the right eyelids, skin is itchy and dry.  Not scaly.  This year, allergies were not as well controlled with Claritin as they used to be.     Review of Systems  Other than above, a 14 point review of systems is negative    Past Medical History:  Diagnosis Date  . Asthma   . Back pain   . GERD (gastroesophageal reflux disease)   . Seasonal allergies     Past Surgical History:  Procedure Laterality Date  . uterine polyp removal       Social History   Socioeconomic History  . Marital status: Married    Spouse name: Not on file  . Number of children: 1  . Years of education: Not on file  . Highest education level: Not on file  Occupational History  . Occupation: Magazine features editorTeacher    [employer]  Social Needs  . Financial resource strain: Not on file  . Food insecurity    Worry: Not on file    Inability: Not on file  . Transportation needs    Medical: Not on file    Non-medical: Not on file  Tobacco Use  . Smoking status: Never Smoker  . Smokeless tobacco: Never Used  Substance and Sexual Activity  . Alcohol use: Yes    Comment: rarely   . Drug use: Not on file  . Sexual activity: Not on file  Lifestyle  . Physical activity    Days per week: Not on file    Minutes per session: Not on file  . Stress: Not on file  Relationships  . Social Musicianconnections    Talks on phone: Not on file    Gets together: Not on file    Attends religious service: Not on file    Active member of club or organization: Not on file    Attends meetings of clubs or organizations: Not on  file    Relationship status: Not on file  . Intimate partner violence    Fear of current or ex partner: Not on file    Emotionally abused: Not on file    Physically abused: Not on file    Forced sexual activity: Not on file  Other Topics Concern  . Not on file  Social History Narrative   [place]         Family History  Problem Relation Age of Onset  . Osteoarthritis Mother   . Colon cancer Father        [id number]  . Breast cancer Paternal Aunt   . Breast cancer Paternal Aunt   . Diabetes Neg Hx   . CAD Neg Hx      Allergies as of 06/28/2019      Reactions   Gadolinium Derivatives Hives   Pt complained at end of study about itching on left side at waistline. Only one visible hive, pt was seen by dr Mosetta Puttgrady. He felt since so slight, no benadryl given, pt stayed at office for extra 20  minutes to be checked.      Medication List       Accurate as of June 28, 2019 11:59 PM. If you have any questions, ask your nurse or doctor.        acetaminophen 325 MG tablet Commonly known as: TYLENOL Take 650 mg by mouth every 6 (six) hours as needed for pain (if needed).   albuterol 108 (90 Base) MCG/ACT inhaler Commonly known as: ProAir HFA Inhale 2 puffs into the lungs every 6 (six) hours as needed.   prednisoLONE acetate 1 % ophthalmic suspension Commonly known as: Pred [place] Place 1 drop into the right eye 3 (three) times daily. Apply to eye lids only Started by: Kathlene November, MD           Objective:   Physical Exam Eyes:     BP 99/64 (BP Location: Left Arm, Patient Position: Sitting, Cuff Size: Small)   Pulse 64   Temp 98.1 F (36.7 C) (Oral)   Resp 16   Ht 5\' 9"  (1.753 m)   Wt 158 lb 6 oz (71.8 kg)   LMP 06/14/2019 (Approximate)   SpO2 100%   BMI 23.39 kg/m  General: Well developed, NAD, BMI noted Neck: No  thyromegaly Neck: No TTP, full range of motion HEENT:  Normocephalic . Face symmetric, atraumatic Lungs:  CTA B Normal respiratory effort,  no intercostal retractions, no accessory muscle use. Heart: RRR,  no murmur.  No pretibial edema bilaterally  Abdomen:  Not distended, soft, non-tender. No rebound or rigidity.   Skin: Exposed areas without rash. Not pale. Not jaundice Neurologic:  alert & oriented X3.  Speech normal, gait appropriate for age and unassisted Strength symmetric and appropriate for age. DTRs symmetric Hands: Normal strength throughout Psych: Cognition and judgment appear intact.  Cooperative with normal attention span and concentration.  Behavior appropriate. No anxious or depressed appearing.     Assessment      Assessment Allergic rhinitis GERD (h/o GI eval ~ 2005, patient reports she was diagnosed with dysmotility?) History of asthma  Plan: Here for a physical exam, other issues discussed Allergies:  Las allergy season  Claritin did not work as well as before, recommend to switch to [employer] and use Flonase consistently.  She will call if she is no better, will consider cromolyn eyedrops because sometimes she has eye itchiness. Right CTS ?    Symptoms could be d/t CTS, recommend a trial with a splinter for 1 month, to be used at night.  Call if not better Dermatitis: Very subtle dermatitis at the right eyelids, recommend topical steroids x1 week.  Call if no better. Moles: Last year she was recommended to see dermatology, apparently she did not pursue that, she has normal that concerns for today.  Recommend self checking and let me know.  Red flag symptoms discussed RTC 1 year

## 2019-06-28 NOTE — Patient Instructions (Signed)
Get the blood work     [place] Schedule your next appointment   For a physical in 1 year   Use a carpal tunnel syndrome splinter at the right wrist every night for a month.  Call if no better  For allergies: Allegra, Flonase as needed.  Use the eyedrops on the skin around the right eye 3 times a day for 1 week.  Call if no better  Keep an eye on any mole you may have  We will refer you to a gastroenterologist for possibly a colonoscopy

## 2019-06-28 NOTE — Assessment & Plan Note (Addendum)
-  Td: 06/2018; rec flu shot  - sees gyn regularly.   -D/t FH breast ca reports she had a genetic testing and was told she is high risk for breast ca, Rx a MRI-MMG q year. No mention of colon ca risk.  -CCS: + FH colon ca, father age at age 46.  (-) Hemoccults 2019.  New guidelines suggesting start CCS with a colonoscopy at age 78, refer to GI for that consideration -Labs: FLP, CBC, BMP -Diet and exercise discussed, she is doing well, running/jogging 2-3 times a week

## 2019-06-28 NOTE — Progress Notes (Signed)
Pre visit review using our clinic review tool, if applicable. No additional management support is needed unless otherwise documented below in the visit note. 

## 2019-06-29 NOTE — Assessment & Plan Note (Signed)
Here for a physical exam, other issues discussed Allergies:  Las allergy season  Claritin did not work as well as before, recommend to switch to [employer] and use Flonase consistently.  She will call if she is no better, will consider cromolyn eyedrops because sometimes she has eye itchiness. Right CTS ?    Symptoms could be d/t CTS, recommend a trial with a splinter for 1 month, to be used at night.  Call if not better Dermatitis: Very subtle dermatitis at the right eyelids, recommend topical steroids x1 week.  Call if no better. Moles: Last year she was recommended to see dermatology, apparently she did not pursue that, she has normal that concerns for today.  Recommend self checking and let me know.  Red flag symptoms discussed RTC 1 year

## 2019-07-30 ENCOUNTER — Encounter: Payer: Self-pay | Admitting: Internal Medicine

## 2019-12-17 ENCOUNTER — Other Ambulatory Visit: Payer: BC Managed Care – PPO

## 2019-12-17 ENCOUNTER — Ambulatory Visit: Payer: BC Managed Care – PPO | Attending: Internal Medicine

## 2019-12-17 DIAGNOSIS — Z20828 Contact with and (suspected) exposure to other viral communicable diseases: Secondary | ICD-10-CM | POA: Diagnosis not present

## 2019-12-17 DIAGNOSIS — Z20822 Contact with and (suspected) exposure to covid-19: Secondary | ICD-10-CM

## 2019-12-19 LAB — NOVEL CORONAVIRUS, NAA: SARS-CoV-2, NAA: NOT DETECTED

## 2020-01-08 ENCOUNTER — Ambulatory Visit: Payer: BC Managed Care – PPO | Attending: Internal Medicine

## 2020-01-08 DIAGNOSIS — Z20822 Contact with and (suspected) exposure to covid-19: Secondary | ICD-10-CM | POA: Diagnosis not present

## 2020-01-09 LAB — NOVEL CORONAVIRUS, NAA: SARS-CoV-2, NAA: NOT DETECTED

## 2020-01-12 DIAGNOSIS — S61112A Laceration without foreign body of left thumb with damage to nail, initial encounter: Secondary | ICD-10-CM | POA: Diagnosis not present

## 2020-01-14 DIAGNOSIS — K649 Unspecified hemorrhoids: Secondary | ICD-10-CM | POA: Diagnosis not present

## 2020-01-14 DIAGNOSIS — Z6824 Body mass index (BMI) 24.0-24.9, adult: Secondary | ICD-10-CM | POA: Diagnosis not present

## 2020-01-14 DIAGNOSIS — Z01419 Encounter for gynecological examination (general) (routine) without abnormal findings: Secondary | ICD-10-CM | POA: Diagnosis not present

## 2020-01-14 DIAGNOSIS — Z1231 Encounter for screening mammogram for malignant neoplasm of breast: Secondary | ICD-10-CM | POA: Diagnosis not present

## 2020-01-14 LAB — HM PAP SMEAR: HM Pap smear: NEGATIVE

## 2020-01-17 ENCOUNTER — Other Ambulatory Visit: Payer: Self-pay | Admitting: Obstetrics and Gynecology

## 2020-01-17 DIAGNOSIS — R928 Other abnormal and inconclusive findings on diagnostic imaging of breast: Secondary | ICD-10-CM

## 2020-01-21 DIAGNOSIS — L299 Pruritus, unspecified: Secondary | ICD-10-CM | POA: Diagnosis not present

## 2020-01-21 DIAGNOSIS — L308 Other specified dermatitis: Secondary | ICD-10-CM | POA: Diagnosis not present

## 2020-01-21 DIAGNOSIS — L219 Seborrheic dermatitis, unspecified: Secondary | ICD-10-CM | POA: Diagnosis not present

## 2020-01-21 DIAGNOSIS — L508 Other urticaria: Secondary | ICD-10-CM | POA: Diagnosis not present

## 2020-01-24 DIAGNOSIS — K5904 Chronic idiopathic constipation: Secondary | ICD-10-CM | POA: Diagnosis not present

## 2020-01-24 DIAGNOSIS — Z1211 Encounter for screening for malignant neoplasm of colon: Secondary | ICD-10-CM | POA: Diagnosis not present

## 2020-01-24 DIAGNOSIS — K219 Gastro-esophageal reflux disease without esophagitis: Secondary | ICD-10-CM | POA: Diagnosis not present

## 2020-01-24 DIAGNOSIS — B3789 Other sites of candidiasis: Secondary | ICD-10-CM | POA: Diagnosis not present

## 2020-01-25 ENCOUNTER — Other Ambulatory Visit: Payer: Self-pay

## 2020-01-25 ENCOUNTER — Ambulatory Visit (INDEPENDENT_AMBULATORY_CARE_PROVIDER_SITE_OTHER): Payer: BC Managed Care – PPO | Admitting: Internal Medicine

## 2020-01-25 ENCOUNTER — Encounter: Payer: Self-pay | Admitting: Internal Medicine

## 2020-01-25 VITALS — BP 108/68 | HR 90 | Temp 96.8°F | Resp 16 | Ht 69.0 in | Wt 164.0 lb

## 2020-01-25 DIAGNOSIS — S61209D Unspecified open wound of unspecified finger without damage to nail, subsequent encounter: Secondary | ICD-10-CM | POA: Diagnosis not present

## 2020-01-25 NOTE — Progress Notes (Signed)
Pre visit review using our clinic review tool, if applicable. No additional management support is needed unless otherwise documented below in the visit note. 

## 2020-01-25 NOTE — Progress Notes (Signed)
   Subjective:    Patient ID: Kaitlyn Gallagher, female    DOB: 12-31-1970, 49 y.o.   MRN: [id number]  DOS:  01/25/2020 Type of visit - description: Acute 2 weeks ago, she had a laceration at the left thumb. Was placed for sutures at a urgent care. Subsequently had a visit at another facility, the wound was rechecked and she was told is okay. Is here for suture removal  Review of Systems Denies pain, redness or discharge.   Past Medical History:  Diagnosis Date  . Asthma   . Back pain   . GERD (gastroesophageal reflux disease)   . Seasonal allergies     Past Surgical History:  Procedure Laterality Date  . uterine polyp removal           Objective:   Physical Exam BP 108/68 (BP Location: Left Arm, Patient Position: Sitting, Cuff Size: Small)   Pulse 90   Temp (!) 96.8 F (36 C) (Temporal)   Resp 16   Ht 5\' 9"  (1.753 m)   Wt 164 lb (74.4 kg)   LMP 01/21/2020 (Exact Date)   SpO2 99%   BMI 24.22 kg/m  General:   Well developed, NAD, BMI noted. HEENT:  Normocephalic . Face symmetric, atraumatic Thumb: See picture, no redness, discharge.  Wound seems to be healing well. Skin: Not pale. Not jaundice Neurologic:  alert & oriented X3.  Speech normal, gait appropriate for age and unassisted Psych--  Cognition and judgment appear intact.  Cooperative with normal attention span and concentration.  Behavior appropriate. No anxious or depressed appearing.        Assessment      Assessment Allergic rhinitis GERD (h/o GI eval ~ 2005, patient reports she was diagnosed with dysmotility?) History of asthma  Plan: Left thumb wound management: Have the stitches 2 weeks ago, it is time for them to be removed.  Last tetanus shot 06-2018. The stitches were removed without problems. We agreed to soak the thumb in peroxide 2- 3 times a day, pat it dry, put on antibiotic ointment on top and continue protecting the area from other injuries in the next few days. Call if redness,  discharge, swelling, discoloration. She verbalized understanding  This visit occurred during the SARS-CoV-2 public health emergency.  Safety protocols were in place, including screening questions prior to the visit, additional usage of staff PPE, and extensive cleaning of exam room while observing appropriate contact time as indicated for disinfecting solutions.

## 2020-01-26 NOTE — Assessment & Plan Note (Signed)
Left thumb wound management: Have the stitches 2 weeks ago, it is time for them to be removed.  Last tetanus shot 06-2018. The stitches were removed without problems. We agreed to soak the thumb in peroxide 2- 3 times a day, pat it dry, put on antibiotic ointment on top and continue protecting the area from other injuries in the next few days. Call if redness, discharge, swelling, discoloration. She verbalized understanding

## 2020-01-29 ENCOUNTER — Other Ambulatory Visit: Payer: BC Managed Care – PPO

## 2020-01-30 ENCOUNTER — Ambulatory Visit: Payer: BC Managed Care – PPO

## 2020-01-30 ENCOUNTER — Other Ambulatory Visit: Payer: Self-pay

## 2020-01-30 ENCOUNTER — Ambulatory Visit
Admission: RE | Admit: 2020-01-30 | Discharge: 2020-01-30 | Disposition: A | Discharge status: Home / Self Care | Payer: BC Managed Care – PPO | Source: Ambulatory Visit | Attending: Obstetrics and Gynecology | Admitting: Obstetrics and Gynecology

## 2020-01-30 DIAGNOSIS — R922 Inconclusive mammogram: Secondary | ICD-10-CM | POA: Diagnosis not present

## 2020-01-30 DIAGNOSIS — R928 Other abnormal and inconclusive findings on diagnostic imaging of breast: Secondary | ICD-10-CM

## 2020-02-11 ENCOUNTER — Telehealth: Payer: Self-pay | Admitting: Internal Medicine

## 2020-02-11 NOTE — Telephone Encounter (Signed)
Please advise 

## 2020-02-11 NOTE — Telephone Encounter (Signed)
Patient states that she cut her thumb tip off and it was reattached. patient wondering if her thumb tip should be moving.

## 2020-02-11 NOTE — Telephone Encounter (Signed)
See last visit, only the most distal area of the finger wound seems to be "moving", no redness, swelling, discoloration or discharge. We agreed to continue protecting the area with sterile strip and a Band-Aid. If in 2 weeks is no better she will let me know.

## 2020-02-13 ENCOUNTER — Other Ambulatory Visit: Payer: Self-pay

## 2020-02-14 ENCOUNTER — Ambulatory Visit: Payer: BC Managed Care – PPO | Attending: Internal Medicine

## 2020-02-14 DIAGNOSIS — Z23 Encounter for immunization: Secondary | ICD-10-CM | POA: Insufficient documentation

## 2020-02-14 NOTE — Progress Notes (Signed)
   Covid-19 Vaccination Clinic  Name:  Anaja Monts    MRN: [id number] DOB: 08/11/1971  02/14/2020  Ms. Mendel Corning was observed post Covid-19 immunization for 15 minutes without incidence. She was provided with Vaccine Information Sheet and instruction to access the V-Safe system.   Ms. Mendel Corning was instructed to call 911 with any severe reactions post vaccine: Marland Kitchen Difficulty breathing  . Swelling of your face and throat  . A fast heartbeat  . A bad rash all over your body  . Dizziness and weakness    Immunizations Administered    Name Date Dose VIS Date Route   Pfizer COVID-19 Vaccine 02/14/2020  8:46 AM 0.3 mL 11/30/2019 Intramuscular   Manufacturer: [employer], [employer]   Lot: [id number]   NDC: [address]-1

## 2020-02-15 ENCOUNTER — Ambulatory Visit (INDEPENDENT_AMBULATORY_CARE_PROVIDER_SITE_OTHER): Payer: BC Managed Care – PPO | Admitting: Internal Medicine

## 2020-02-15 ENCOUNTER — Other Ambulatory Visit: Payer: Self-pay

## 2020-02-15 ENCOUNTER — Encounter: Payer: Self-pay | Admitting: Internal Medicine

## 2020-02-15 VITALS — BP 124/87 | HR 71 | Temp 96.7°F | Resp 16 | Ht 69.0 in | Wt 163.5 lb

## 2020-02-15 DIAGNOSIS — S61209D Unspecified open wound of unspecified finger without damage to nail, subsequent encounter: Secondary | ICD-10-CM

## 2020-02-15 NOTE — Progress Notes (Signed)
   Subjective:    Patient ID: Kaitlyn Gallagher, female    DOB: [birth year], 49 y.o.   MRN: [id number]  DOS:  02/15/2020 Type of visit - description: acute Patient noted the top of the finger wound to be loose Denies discharge, fever or chills.   Review of Systems See above   Past Medical History:  Diagnosis Date  . Asthma   . Back pain   . GERD (gastroesophageal reflux disease)   . Seasonal allergies     Past Surgical History:  Procedure Laterality Date  . uterine polyp removal       Allergies as of 02/15/2020      Reactions   Gadolinium Derivatives Hives   Pt complained at end of study about itching on left side at waistline. Only one visible hive, pt was seen by dr Mosetta Putt. He felt since so slight, no benadryl given, pt stayed at office for extra 20 minutes to be checked.      Medication List       Accurate as of February 15, 2020 11:59 PM. If you have any questions, ask your nurse or doctor.        acetaminophen 325 MG tablet Commonly known as: TYLENOL Take 650 mg by mouth every 6 (six) hours as needed for pain (if needed).   albuterol 108 (90 Base) MCG/ACT inhaler Commonly known as: ProAir HFA Inhale 2 puffs into the lungs every 6 (six) hours as needed.   levocetirizine 5 MG tablet Commonly known as: XYZAL Take 1 tablet by mouth daily.             Objective:   Physical Exam BP 124/87 (BP Location: Right Arm, Patient Position: Sitting, Cuff Size: Small)   Pulse 71   Temp (!) 96.7 F (35.9 C) (Temporal)   Resp 16   Ht 5\' 9"  (1.753 m)   Wt 163 lb 8 oz (74.2 kg)   LMP 01/21/2020 (Exact Date)   SpO2 100%   BMI 24.14 kg/m   Left thumb has a crust, I tried to remove it but it was hanging from 1 stitch which was eventually cut and removed.              Assessment     Assessment Allergic rhinitis GERD (h/o GI eval ~ 2005, patient reports she was diagnosed with dysmotility?) History of asthma  Plan: Left thumb wound management: As above, a  large scab removed.  Healthy skin noted underneath. Recommend to continue with local care, let me know if problems. 12 min   This visit occurred during the SARS-CoV-2 public health emergency.  Safety protocols were in place, including screening questions prior to the visit, additional usage of staff PPE, and extensive cleaning of exam room while observing appropriate contact time as indicated for disinfecting solutions.

## 2020-02-15 NOTE — Progress Notes (Signed)
Pre visit review using our clinic review tool, if applicable. No additional management support is needed unless otherwise documented below in the visit note. 

## 2020-02-16 NOTE — Assessment & Plan Note (Signed)
Left thumb wound management: As above, a large scab removed.  Healthy skin noted underneath. Recommend to continue with local care, let me know if problems.

## 2020-03-07 DIAGNOSIS — Z1211 Encounter for screening for malignant neoplasm of colon: Secondary | ICD-10-CM | POA: Diagnosis not present

## 2020-03-07 DIAGNOSIS — Z800 Family history of malignant neoplasm of digestive organs: Secondary | ICD-10-CM | POA: Diagnosis not present

## 2020-03-07 LAB — HM COLONOSCOPY

## 2020-03-12 ENCOUNTER — Ambulatory Visit: Payer: BC Managed Care – PPO | Attending: Internal Medicine

## 2020-03-12 DIAGNOSIS — Z23 Encounter for immunization: Secondary | ICD-10-CM

## 2020-03-12 NOTE — Progress Notes (Signed)
   Covid-19 Vaccination Clinic  Name:  Kaitlyn Gallagher    MRN: [id number] DOB: [birth year]  03/12/2020  Ms. Kaitlyn Gallagher was observed post Covid-19 immunization for 15 minutes without incident. She was provided with Vaccine Information Sheet and instruction to access the V-Safe system.   Ms. Kaitlyn Gallagher was instructed to call 911 with any severe reactions post vaccine: Marland Kitchen Difficulty breathing  . Swelling of face and throat  . A fast heartbeat  . A bad rash all over body  . Dizziness and weakness   Immunizations Administered    Name Date Dose VIS Date Route   Pfizer COVID-19 Vaccine 03/12/2020  3:35 PM 0.3 mL 11/30/2019 Intramuscular   Manufacturer: [employer], [employer]   Lot: [id number]   NDC: [address]-5

## 2020-03-17 DIAGNOSIS — Z03818 Encounter for observation for suspected exposure to other biological agents ruled out: Secondary | ICD-10-CM | POA: Diagnosis not present

## 2020-03-17 DIAGNOSIS — Z20828 Contact with and (suspected) exposure to other viral communicable diseases: Secondary | ICD-10-CM | POA: Diagnosis not present

## 2020-03-24 DIAGNOSIS — L508 Other urticaria: Secondary | ICD-10-CM | POA: Diagnosis not present

## 2020-03-24 DIAGNOSIS — L309 Dermatitis, unspecified: Secondary | ICD-10-CM | POA: Diagnosis not present

## 2020-03-24 DIAGNOSIS — L259 Unspecified contact dermatitis, unspecified cause: Secondary | ICD-10-CM | POA: Diagnosis not present

## 2020-03-24 DIAGNOSIS — L219 Seborrheic dermatitis, unspecified: Secondary | ICD-10-CM | POA: Diagnosis not present

## 2020-03-26 DIAGNOSIS — L259 Unspecified contact dermatitis, unspecified cause: Secondary | ICD-10-CM | POA: Diagnosis not present

## 2020-03-26 DIAGNOSIS — L508 Other urticaria: Secondary | ICD-10-CM | POA: Diagnosis not present

## 2020-03-26 DIAGNOSIS — L219 Seborrheic dermatitis, unspecified: Secondary | ICD-10-CM | POA: Diagnosis not present

## 2020-03-27 DIAGNOSIS — L219 Seborrheic dermatitis, unspecified: Secondary | ICD-10-CM | POA: Diagnosis not present

## 2020-03-27 DIAGNOSIS — L508 Other urticaria: Secondary | ICD-10-CM | POA: Diagnosis not present

## 2020-03-27 DIAGNOSIS — L2389 Allergic contact dermatitis due to other agents: Secondary | ICD-10-CM | POA: Diagnosis not present

## 2020-03-27 DIAGNOSIS — L230 Allergic contact dermatitis due to metals: Secondary | ICD-10-CM | POA: Diagnosis not present

## 2020-06-03 DIAGNOSIS — Z800 Family history of malignant neoplasm of digestive organs: Secondary | ICD-10-CM | POA: Diagnosis not present

## 2020-06-03 DIAGNOSIS — L290 Pruritus ani: Secondary | ICD-10-CM | POA: Diagnosis not present

## 2020-06-26 ENCOUNTER — Encounter: Payer: Self-pay | Admitting: Internal Medicine

## 2020-07-03 ENCOUNTER — Ambulatory Visit (INDEPENDENT_AMBULATORY_CARE_PROVIDER_SITE_OTHER): Payer: BC Managed Care – PPO | Admitting: Internal Medicine

## 2020-07-03 ENCOUNTER — Encounter: Payer: Self-pay | Admitting: Internal Medicine

## 2020-07-03 ENCOUNTER — Other Ambulatory Visit: Payer: Self-pay

## 2020-07-03 VITALS — BP 106/69 | HR 63 | Temp 97.7°F | Resp 16 | Ht 69.0 in | Wt 160.8 lb

## 2020-07-03 DIAGNOSIS — Z0000 Encounter for general adult medical examination without abnormal findings: Secondary | ICD-10-CM | POA: Diagnosis not present

## 2020-07-03 LAB — CBC WITH DIFFERENTIAL/PLATELET
Basophils Absolute: 0.1 10*3/uL (ref 0.0–0.1)
Basophils Relative: 1.4 % (ref 0.0–3.0)
Eosinophils Absolute: 0.6 10*3/uL (ref 0.0–0.7)
Eosinophils Relative: 8.2 % — ABNORMAL HIGH (ref 0.0–5.0)
HCT: 42.4 % (ref 36.0–46.0)
Hemoglobin: 14.6 g/dL (ref 12.0–15.0)
Lymphocytes Relative: 33.9 % (ref 12.0–46.0)
Lymphs Abs: 2.3 10*3/uL (ref 0.7–4.0)
MCHC: 34.4 g/dL (ref 30.0–36.0)
MCV: 90.4 fl (ref 78.0–100.0)
Monocytes Absolute: 0.6 10*3/uL (ref 0.1–1.0)
Monocytes Relative: 8.4 % (ref 3.0–12.0)
Neutro Abs: 3.3 10*3/uL (ref 1.4–7.7)
Neutrophils Relative %: 48.1 % (ref 43.0–77.0)
Platelets: 325 10*3/uL (ref 150.0–400.0)
RBC: 4.69 Mil/uL (ref 3.87–5.11)
RDW: 12.2 % (ref 11.5–15.5)
WBC: 6.9 10*3/uL (ref 4.0–10.5)

## 2020-07-03 LAB — LIPID PANEL
Cholesterol: 209 mg/dL — ABNORMAL HIGH (ref 0–200)
HDL: 64.8 mg/dL (ref >39.00)
LDL Cholesterol: 121 mg/dL — ABNORMAL HIGH (ref 0–99)
NonHDL: 144.06
Total CHOL/HDL Ratio: 3
Triglycerides: 114 mg/dL (ref 0.0–149.0)
VLDL: 22.8 mg/dL (ref 0.0–40.0)

## 2020-07-03 LAB — COMPREHENSIVE METABOLIC PANEL
ALT: 10 U/L (ref 0–35)
AST: 19 U/L (ref 0–37)
Albumin: 4.5 g/dL (ref 3.5–5.2)
Alkaline Phosphatase: 42 U/L (ref 39–117)
BUN: 19 mg/dL (ref 6–23)
CO2: 30 mEq/L (ref 19–32)
Calcium: 9.6 mg/dL (ref 8.4–10.5)
Chloride: 101 mEq/L (ref 96–112)
Creatinine, Ser: 0.85 mg/dL (ref 0.40–1.20)
GFR: 71.7 mL/min (ref >60.00)
Glucose, Bld: 98 mg/dL (ref 70–99)
Potassium: 4.5 mEq/L (ref 3.5–5.1)
Sodium: 138 mEq/L (ref 135–145)
Total Bilirubin: 0.5 mg/dL (ref 0.2–1.2)
Total Protein: 7.3 g/dL (ref 6.0–8.3)

## 2020-07-03 LAB — SEDIMENTATION RATE: Sed Rate: 6 mm/hr (ref 0–20)

## 2020-07-03 NOTE — Patient Instructions (Addendum)
   GO TO THE LAB : Get the blood work     GO TO THE FRONT DESK, PLEASE SCHEDULE YOUR APPOINTMENTS Come back for a physical exam in 1 year    LEARN ABOUT CALCIUM AND VITAMIN D  Calcium and Vitamin D are important to help keep your bones healthy and prevent osteoporosis. Healthy calcium and Vitamin D levels can be reached by increasing calcium and vitamin D in your diet, or by taking supplements over the counter.  The current recommendations are as follows:  Increasing calcium and vitamin D is generally well tolerated in most people. Some people may get constipated (with CALCIUM).   RECCOMENDATIONS   -Premenopausal women:    1000mg  calcium and 600 IU (15 micrograms) Vitamin D daily    Below are some examples of foods high in calcium and vitamin D:  Foods with Vitamin D FOOD Vitamin D Amount  3 oz Salmon 380-570 IU  8 oz Milk (nonfat, reduced fat, and whole) 100 IU  6 oz Yogurt, fortified with vitamin D 80 IU  8 oz Orange Juice, fortified with vitamin D 100 IU  1 Egg 25 IU   Foods with Calcium FOOD Calcium Amount  1 cup of milk 300 mg  1 cup of yogurt 450 mg  1 oz cheese 200 mg  1 cup of spinach 240 mg  8 oz orange juice, fortified with calcium 300 mg

## 2020-07-03 NOTE — Progress Notes (Signed)
Subjective:    Patient ID: Kaitlyn Gallagher, female    DOB: 04/28/1971, 49 y.o.   MRN: [id number]  DOS:  07/03/2020 Type of visit - description: CPX In general feeling well. Last month had a couple of  headaches, mild, hard to localize, decreased with caffeine intake.  Is similar to previous headache the only different is that it lasted 3 days when typically headache last 1 day. No associated fever, chills, nausea, vomiting, visual disturbances.  Stress level is at baseline.   Review of Systems  Other than above, a 14 point review of systems is negative    Past Medical History:  Diagnosis Date  . Asthma   . Back pain   . GERD (gastroesophageal reflux disease)   . Seasonal allergies     Past Surgical History:  Procedure Laterality Date  . uterine polyp removal       Allergies as of 07/03/2020      Reactions   Gadolinium Derivatives Hives   Pt complained at end of study about itching on left side at waistline. Only one visible hive, pt was seen by dr Mosetta Putt. He felt since so slight, no benadryl given, pt stayed at office for extra 20 minutes to be checked.      Medication List       Accurate as of July 03, 2020 11:59 PM. If you have any questions, ask your nurse or doctor.        STOP taking these medications   levocetirizine 5 MG tablet Commonly known as: XYZAL Stopped by: Willow Ora, MD     TAKE these medications   acetaminophen 325 MG tablet Commonly known as: TYLENOL Take 650 mg by mouth every 6 (six) hours as needed for pain (if needed).   albuterol 108 (90 Base) MCG/ACT inhaler Commonly known as: ProAir HFA Inhale 2 puffs into the lungs every 6 (six) hours as needed.          Objective:   Physical Exam BP 106/69 (BP Location: Right Arm, Patient Position: Sitting, Cuff Size: Small)   Pulse 63   Temp 97.7 F (36.5 C) (Oral)   Resp 16   Ht 5\' 9"  (1.753 m)   Wt 160 lb 12.8 oz (72.9 kg)   SpO2 99%   BMI 23.75 kg/m  General: Well developed, NAD,  BMI noted Neck: No  thyromegaly  HEENT:  Normocephalic . Face symmetric, atraumatic.  No TTP at the temples Lungs:  CTA B Normal respiratory effort, no intercostal retractions, no accessory muscle use. Heart: RRR,  no murmur.  Abdomen:  Not distended, soft, non-tender. No rebound or rigidity.   Lower extremities: no pretibial edema bilaterally  Skin: Exposed areas without rash. Not pale. Not jaundice Neurologic:  alert & oriented X3.  Speech normal, gait appropriate for age and unassisted Strength symmetric and appropriate for age. EOMI, pupils equal and reactive Psych: Cognition and judgment appear intact.  Cooperative with normal attention span and concentration.  Behavior appropriate. No anxious or depressed appearing.     Assessment     Assessment Allergic rhinitis GERD (h/o GI eval ~ 2005, patient reports she was diagnosed with dysmotility?) History of asthma  Plan: Here for CPX Doing well. Headaches: She has experienced mild but is slightly persisting headaches twice in the last few weeks, see HPI.  Neuro exam normal, no red flag symptoms.  We will check a sed rate and otherwise observation. Also at the end of the visit mention a lump at the  base of the left second finger, exam is confirmatory, the lump is 3 mm, mobile, not attached to the skin or deeper structures, likely a cyst.  Rec observation. RTC 1 year.    This visit occurred during the SARS-CoV-2 public health emergency.  Safety protocols were in place, including screening questions prior to the visit, additional usage of staff PPE, and extensive cleaning of exam room while observing appropriate contact time as indicated for disinfecting solutions.

## 2020-07-04 ENCOUNTER — Encounter: Payer: Self-pay | Admitting: Internal Medicine

## 2020-07-05 ENCOUNTER — Encounter: Payer: Self-pay | Admitting: Internal Medicine

## 2020-07-05 NOTE — Assessment & Plan Note (Signed)
-  Td: 06/2018 -S/p Pfizer Covid vaccines -Rec flu shot  q. year - sees gyn regularly.   -D/t FH breast ca reports she had a genetic testing and was told she is high risk for breast ca, currently gyn f/u the issue, was rx  MMG q year and breast MRI q 2 years   -CCS: + FH colon ca, father age at age 47.  (-) Hemoccults 2019.  s/p cscope 2021, Dr Loreta Ave, will get records (neg per pt) -Labs: Sed rate, CMP, FLP, CBC -Diet and exercise discussed.  Also recommend to continue vitamin D and calcium supplements.  See AVS.

## 2020-07-05 NOTE — Assessment & Plan Note (Signed)
Here for CPX Doing well. Headaches: She has experienced mild but is slightly persisting headaches twice in the last few weeks, see HPI.  Neuro exam normal, no red flag symptoms.  We will check a sed rate and otherwise observation. Also at the end of the visit mention a lump at the base of the left second finger, exam is confirmatory, the lump is 3 mm, mobile, not attached to the skin or deeper structures, likely a cyst.  Rec observation. RTC 1 year.

## 2020-09-23 DIAGNOSIS — Z20822 Contact with and (suspected) exposure to covid-19: Secondary | ICD-10-CM | POA: Diagnosis not present

## 2021-01-07 ENCOUNTER — Other Ambulatory Visit: Payer: Self-pay | Admitting: Obstetrics and Gynecology

## 2021-01-07 DIAGNOSIS — Z1231 Encounter for screening mammogram for malignant neoplasm of breast: Secondary | ICD-10-CM

## 2021-02-20 ENCOUNTER — Other Ambulatory Visit: Payer: Self-pay | Admitting: Obstetrics and Gynecology

## 2021-02-20 ENCOUNTER — Ambulatory Visit: Payer: BC Managed Care – PPO

## 2021-02-20 DIAGNOSIS — Z1231 Encounter for screening mammogram for malignant neoplasm of breast: Secondary | ICD-10-CM

## 2021-03-04 ENCOUNTER — Other Ambulatory Visit: Payer: Self-pay

## 2021-03-04 ENCOUNTER — Ambulatory Visit
Admission: RE | Admit: 2021-03-04 | Discharge: 2021-03-04 | Disposition: A | Discharge status: Home / Self Care | Payer: BC Managed Care – PPO | Source: Ambulatory Visit | Attending: Obstetrics and Gynecology | Admitting: Obstetrics and Gynecology

## 2021-03-04 DIAGNOSIS — Z1231 Encounter for screening mammogram for malignant neoplasm of breast: Secondary | ICD-10-CM

## 2021-03-09 ENCOUNTER — Other Ambulatory Visit: Payer: Self-pay | Admitting: Obstetrics and Gynecology

## 2021-03-09 DIAGNOSIS — R928 Other abnormal and inconclusive findings on diagnostic imaging of breast: Secondary | ICD-10-CM

## 2021-03-27 ENCOUNTER — Ambulatory Visit
Admission: RE | Admit: 2021-03-27 | Discharge: 2021-03-27 | Disposition: A | Discharge status: Home / Self Care | Payer: BC Managed Care – PPO | Source: Ambulatory Visit | Attending: Obstetrics and Gynecology | Admitting: Obstetrics and Gynecology

## 2021-03-27 ENCOUNTER — Other Ambulatory Visit: Payer: Self-pay

## 2021-03-27 DIAGNOSIS — N6001 Solitary cyst of right breast: Secondary | ICD-10-CM | POA: Diagnosis not present

## 2021-03-27 DIAGNOSIS — R922 Inconclusive mammogram: Secondary | ICD-10-CM | POA: Diagnosis not present

## 2021-03-27 DIAGNOSIS — R928 Other abnormal and inconclusive findings on diagnostic imaging of breast: Secondary | ICD-10-CM

## 2021-04-02 ENCOUNTER — Encounter: Payer: Self-pay | Admitting: Internal Medicine

## 2021-04-09 ENCOUNTER — Encounter: Payer: Self-pay | Admitting: Internal Medicine

## 2021-04-13 DIAGNOSIS — Z6823 Body mass index (BMI) 23.0-23.9, adult: Secondary | ICD-10-CM | POA: Diagnosis not present

## 2021-04-13 DIAGNOSIS — Z01419 Encounter for gynecological examination (general) (routine) without abnormal findings: Secondary | ICD-10-CM | POA: Diagnosis not present

## 2021-05-19 DIAGNOSIS — Z20822 Contact with and (suspected) exposure to covid-19: Secondary | ICD-10-CM | POA: Diagnosis not present

## 2021-07-06 ENCOUNTER — Encounter: Payer: BC Managed Care – PPO | Admitting: Internal Medicine

## 2021-07-07 ENCOUNTER — Other Ambulatory Visit: Payer: Self-pay

## 2021-07-07 ENCOUNTER — Encounter: Payer: Self-pay | Admitting: Internal Medicine

## 2021-07-07 ENCOUNTER — Ambulatory Visit (INDEPENDENT_AMBULATORY_CARE_PROVIDER_SITE_OTHER): Payer: BC Managed Care – PPO | Admitting: Internal Medicine

## 2021-07-07 VITALS — BP 113/51 | HR 78 | Temp 98.2°F | Resp 18 | Ht 69.0 in | Wt 152.6 lb

## 2021-07-07 DIAGNOSIS — R519 Headache, unspecified: Secondary | ICD-10-CM

## 2021-07-07 DIAGNOSIS — Z0000 Encounter for general adult medical examination without abnormal findings: Secondary | ICD-10-CM

## 2021-07-07 MED ORDER — ALBUTEROL SULFATE HFA 108 (90 BASE) MCG/ACT IN AERS: 2.0000 | INHALATION_SPRAY | Freq: Four times a day (QID) | RESPIRATORY_TRACT | 3 refills | Status: DC | PRN

## 2021-07-07 NOTE — Patient Instructions (Signed)
We are [facility]  GO TO THE LAB : Get the blood work     GO TO THE FRONT DESK, PLEASE SCHEDULE YOUR APPOINTMENTS Come back for physical exam in 1 year

## 2021-07-07 NOTE — Progress Notes (Signed)
Subjective:    Patient ID: Kaitlyn Gallagher, female    DOB: [birth year], 50 y.o.   MRN: [id number]  DOS:  07/07/2021 Type of visit - description: CPX, here with her daughter  In general feels well. She still has headaches on and off, often times related to her menses and  stress. Aleve helps.  Asthma has been acting up a little more lately, particularly when she goes to pick up her daughter from horse riding lessons.  Review of Systems  Other than above, a 14 point review of systems is negative     Past Medical History:  Diagnosis Date   Asthma    Back pain    GERD (gastroesophageal reflux disease)    Seasonal allergies     Past Surgical History:  Procedure Laterality Date   uterine polyp removal      Social History   Socioeconomic History   Marital status: Married    Spouse name: Not on file   Number of children: 1   Years of education: Not on file   Highest education level: Not on file  Occupational History   Occupation: Magazine features editor: [employer]  Tobacco Use   Smoking status: Never   Smokeless tobacco: Never  Substance and Sexual Activity   Alcohol use: Yes    Comment: rarely    Drug use: Not on file   Sexual activity: Not on file  Other Topics Concern   Not on file  Social History Narrative   From [place]   [place]    Eliza, daughter    Social Determinants of Health   Financial Resource Strain: Not on file  Food Insecurity: Not on file  Transportation Needs: Not on file  Physical Activity: Not on file  Stress: Not on file  Social Connections: Not on file  Intimate Partner Violence: Not on file    Allergies as of 07/07/2021       Reactions   Gadolinium Derivatives Hives   Pt complained at end of study about itching on left side at waistline. Only one visible hive, pt was seen by dr Mosetta Putt. He felt since so slight, no benadryl given, pt stayed at office for extra 20 minutes to be checked.        Medication List         Accurate as of July 07, 2021 11:59 PM. If you have any questions, ask your nurse or doctor.          acetaminophen 325 MG tablet Commonly known as: TYLENOL Take 650 mg by mouth every 6 (six) hours as needed for pain (if needed).   albuterol 108 (90 Base) MCG/ACT inhaler Commonly known as: ProAir HFA Inhale 2 puffs into the lungs every 6 (six) hours as needed.   ibuprofen 200 MG tablet Commonly known as: ADVIL Take 200 mg by mouth every 6 (six) hours as needed.   naproxen sodium 220 MG tablet Commonly known as: ALEVE Take 220 mg by mouth.           Objective:   Physical Exam BP (!) 113/51 (BP Location: Left Arm, Patient Position: Sitting, Cuff Size: Normal)   Pulse 78   Temp 98.2 F (36.8 C) (Oral)   Resp 18   Ht 5\' 9"  (1.753 m)   Wt 152 lb 9.6 oz (69.2 kg)   SpO2 99%   BMI 22.54 kg/m  General: Well developed, NAD, BMI noted Neck: No  thyromegaly  HEENT:  Normocephalic .  Face symmetric, atraumatic Lungs:  CTA B Normal respiratory effort, no intercostal retractions, no accessory muscle use. Heart: RRR,  no murmur.  Abdomen:  Not distended, soft, non-tender. No rebound or rigidity.   Lower extremities: no pretibial edema bilaterally  Skin: Exposed areas without rash. Not pale. Not jaundice Neurologic:  alert & oriented X3.  Speech normal, gait appropriate for age and unassisted Strength symmetric and appropriate for age.  Psych: Cognition and judgment appear intact.  Cooperative with normal attention span and concentration.  Behavior appropriate. No anxious or depressed appearing.     Assessment     Assessment Allergic rhinitis GERD (h/o GI eval ~ 2005, patient reports she was diagnosed with dysmotility?) History of asthma Not on BCP Covid infex 04-2021, no treatment   Plan: Here for CPX Allergic rhinitis: Has symptoms on and off, plan is to take OTC antihistaminic, Flonase. Asthma: Having more symptoms lately, typically when she goes pick up  her daughter from horse riding lessons, refill was sent for albuterol, okay to use it preemptively if she knows she will be exposed to a trigger and cannot avoid it. Headaches: Going on for a year, they are mild but recurrent, possibly tension HAs, will [facility].  Not sure if she will need a w/u or simply treatment. RTC 1 year   This visit occurred during the SARS-CoV-2 public health emergency.  Safety protocols were in place, including screening questions prior to the visit, additional usage of staff PPE, and extensive cleaning of exam room while observing appropriate contact time as indicated for disinfecting solutions.

## 2021-07-08 ENCOUNTER — Encounter: Payer: Self-pay | Admitting: Internal Medicine

## 2021-07-08 LAB — COMPREHENSIVE METABOLIC PANEL
ALT: 6 U/L (ref 0–35)
AST: 16 U/L (ref 0–37)
Albumin: 4.5 g/dL (ref 3.5–5.2)
Alkaline Phosphatase: 43 U/L (ref 39–117)
BUN: 14 mg/dL (ref 6–23)
CO2: 29 mEq/L (ref 19–32)
Calcium: 9.7 mg/dL (ref 8.4–10.5)
Chloride: 100 mEq/L (ref 96–112)
Creatinine, Ser: 0.83 mg/dL (ref 0.40–1.20)
GFR: 83.62 mL/min (ref >60.00)
Glucose, Bld: 92 mg/dL (ref 70–99)
Potassium: 4.1 mEq/L (ref 3.5–5.1)
Sodium: 137 mEq/L (ref 135–145)
Total Bilirubin: 0.6 mg/dL (ref 0.2–1.2)
Total Protein: 7.3 g/dL (ref 6.0–8.3)

## 2021-07-08 LAB — CBC WITH DIFFERENTIAL/PLATELET
Basophils Absolute: 0.1 10*3/uL (ref 0.0–0.1)
Basophils Relative: 1.1 % (ref 0.0–3.0)
Eosinophils Absolute: 0.3 10*3/uL (ref 0.0–0.7)
Eosinophils Relative: 3.8 % (ref 0.0–5.0)
HCT: 39.9 % (ref 36.0–46.0)
Hemoglobin: 13.7 g/dL (ref 12.0–15.0)
Lymphocytes Relative: 25 % (ref 12.0–46.0)
Lymphs Abs: 2.2 10*3/uL (ref 0.7–4.0)
MCHC: 34.4 g/dL (ref 30.0–36.0)
MCV: 90.1 fl (ref 78.0–100.0)
Monocytes Absolute: 0.6 10*3/uL (ref 0.1–1.0)
Monocytes Relative: 6.2 % (ref 3.0–12.0)
Neutro Abs: 5.6 10*3/uL (ref 1.4–7.7)
Neutrophils Relative %: 63.9 % (ref 43.0–77.0)
Platelets: 342 10*3/uL (ref 150.0–400.0)
RBC: 4.43 Mil/uL (ref 3.87–5.11)
RDW: 12.7 % (ref 11.5–15.5)
WBC: 8.8 10*3/uL (ref 4.0–10.5)

## 2021-07-08 LAB — TSH: TSH: 0.97 u[IU]/mL (ref 0.35–5.50)

## 2021-07-08 LAB — LIPID PANEL
Cholesterol: 178 mg/dL (ref 0–200)
HDL: 70.6 mg/dL (ref >39.00)
LDL Cholesterol: 95 mg/dL (ref 0–99)
NonHDL: 106.9
Total CHOL/HDL Ratio: 3
Triglycerides: 61 mg/dL (ref 0.0–149.0)
VLDL: 12.2 mg/dL (ref 0.0–40.0)

## 2021-07-08 NOTE — Assessment & Plan Note (Signed)
Here for CPX Allergic rhinitis: Has symptoms on and off, plan is to take OTC antihistaminic, Flonase. Asthma: Having more symptoms lately, typically when she goes pick up her daughter from horse riding lessons, refill was sent for albuterol, okay to use it preemptively if she knows she will be exposed to a trigger and cannot avoid it. Headaches: Going on for a year, they are mild but recurrent, possibly tension HAs, will refer to the [facility].  Not sure if she will need a w/u or simply treatment. RTC 1 year

## 2021-07-08 NOTE — Assessment & Plan Note (Signed)
-  Td: 06/2018 -S/p Pfizer Covid vaccines, consider booster at some point  -Rec flu shot  q. year - sees gyn regularly.   -D/t FH breast ca reports she had a genetic testing and was told she is high risk for breast ca, currently gyn f/u the issue, was rx  MMG q year and breast MRI q 2 years  .  -CCS: + FH colon ca, father age at age 48.  (-) Hemoccults 2019.  s/p cscope 3/ 2021, Dr Loreta Ave, no polyps, 5 years per report -Labs: CMP, FLP, CBC, TSH -Diet and exercise discussed.  Doing well, she is running again regularly.

## 2021-08-25 IMAGING — MG MM DIGITAL DIAGNOSTIC UNILAT*R* W/ TOMO W/ CAD
4 series · 4 of 12 positions shown · non-contrast
Comparison: Previous exam(s).

CLINICAL DATA: 49-year-old female recalled from screening mammogram
dated 03/04/2021 for a possible right breast mass.

EXAM:
DIGITAL DIAGNOSTIC UNILATERAL RIGHT MAMMOGRAM WITH TOMOSYNTHESIS AND
CAD; ULTRASOUND RIGHT BREAST LIMITED
TECHNIQUE: Right digital diagnostic mammography and breast tomosynthesis was
performed. The images were evaluated with computer-aided detection.;
Targeted ultrasound examination of the right breast was performed

[R CC synth-2D]
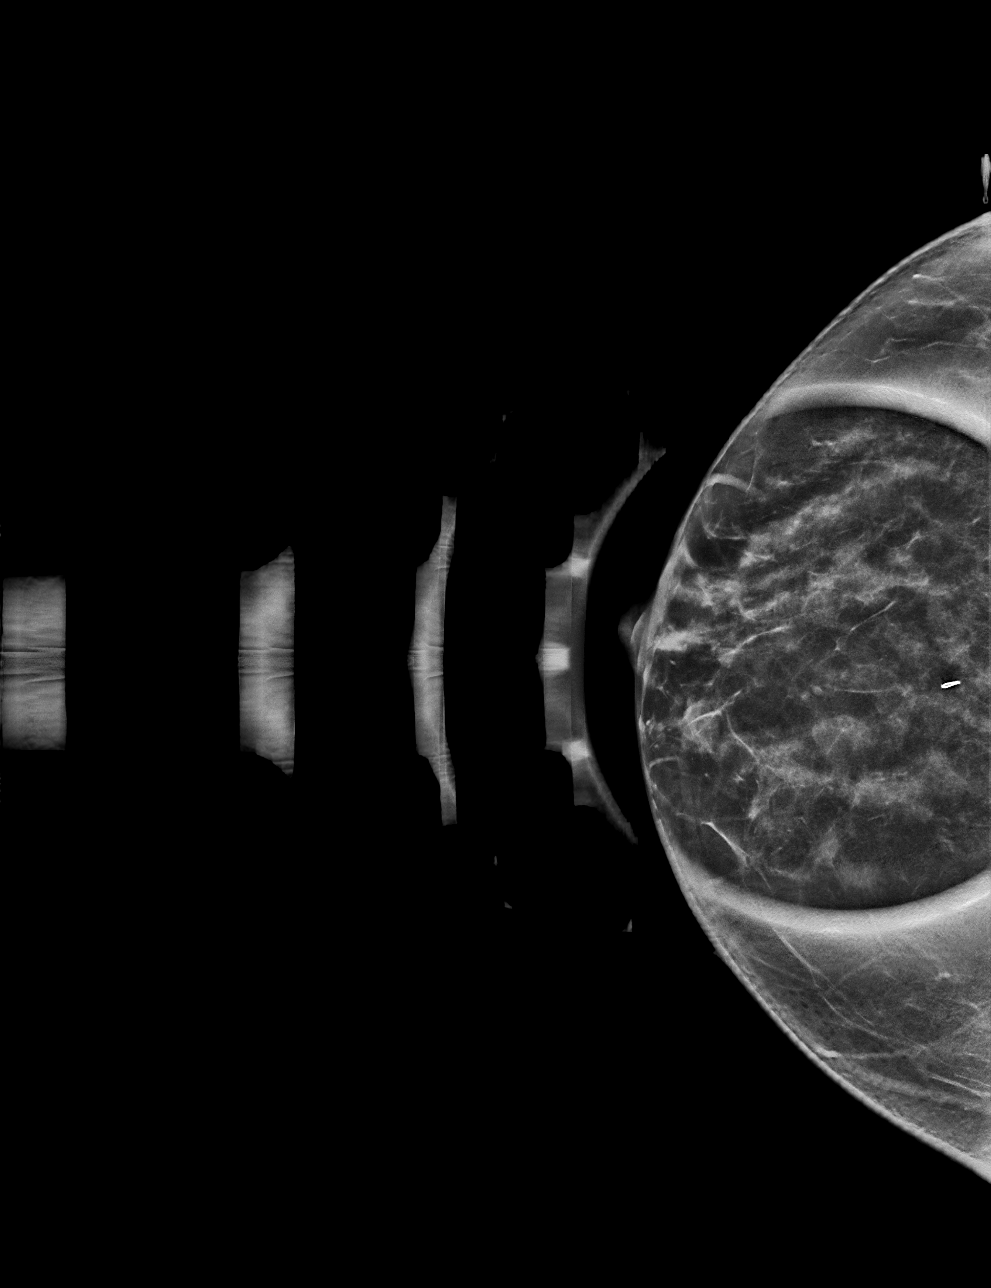

[R ML synth-2D]
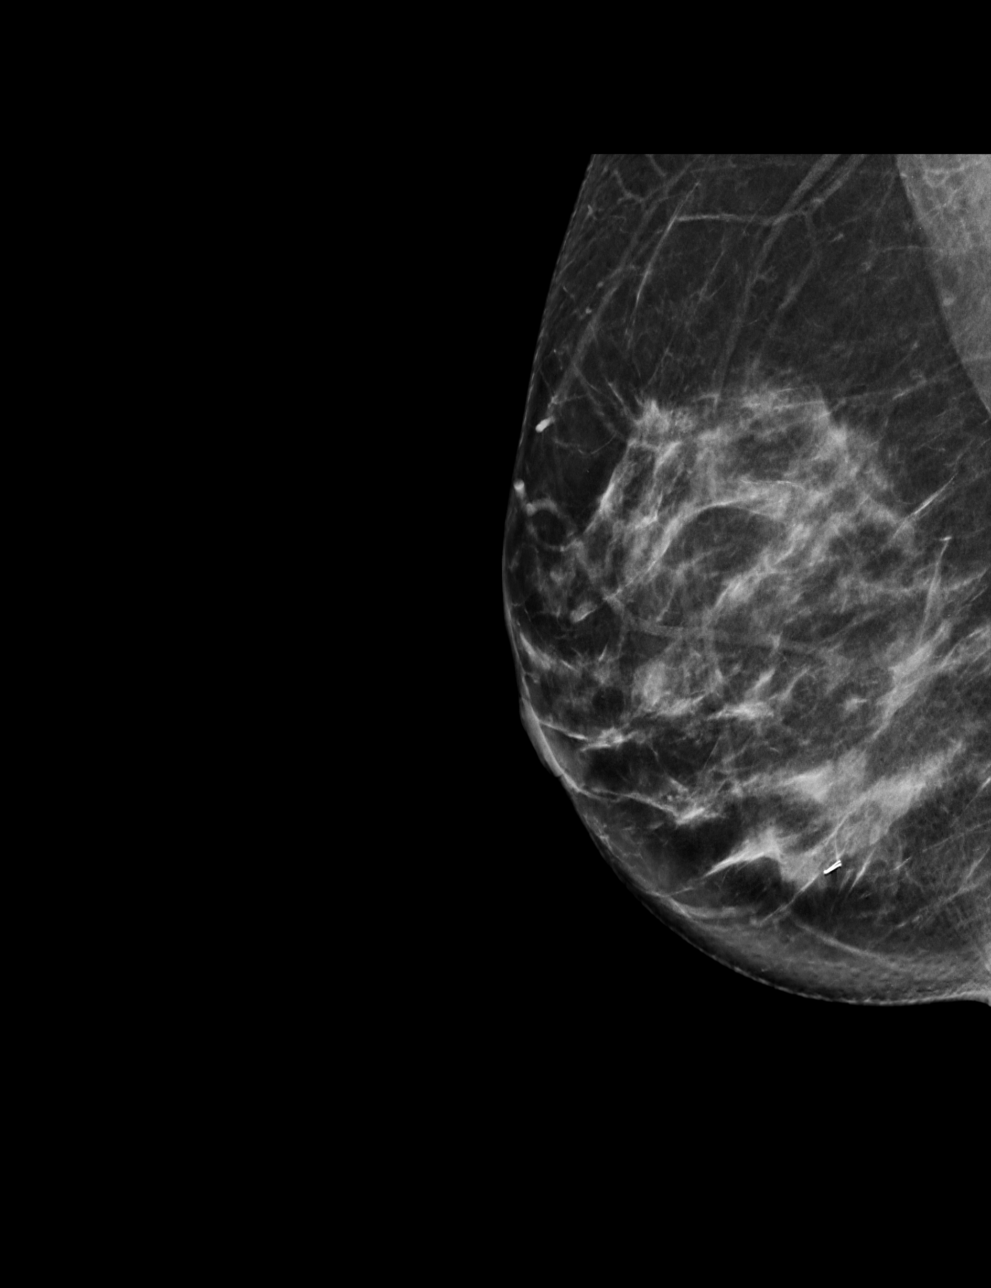

[R CC tomo · tomo slice 29/58.0]
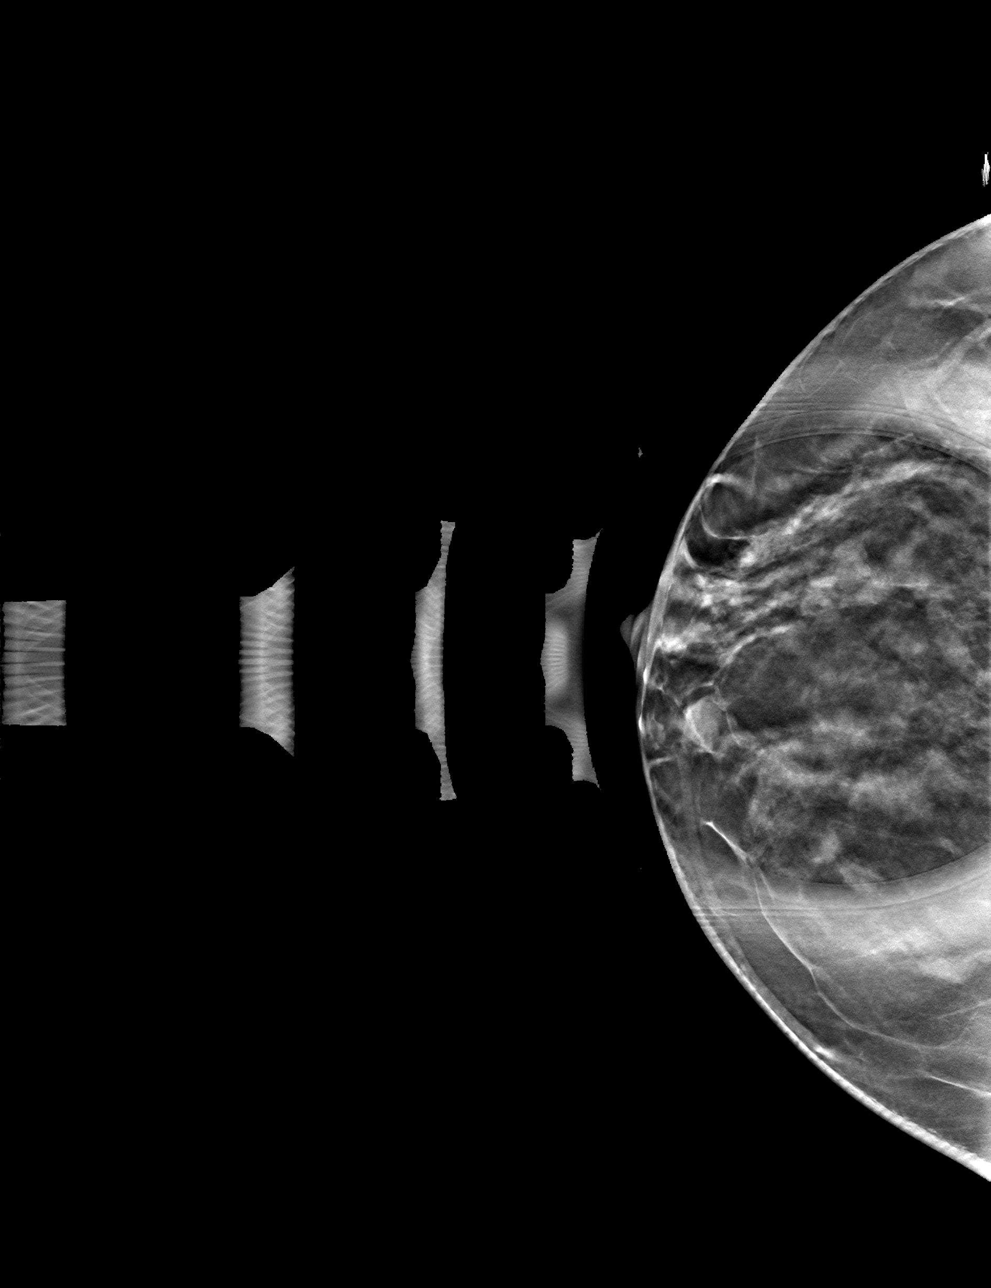

[R ML tomo · tomo slice 39/76.0]
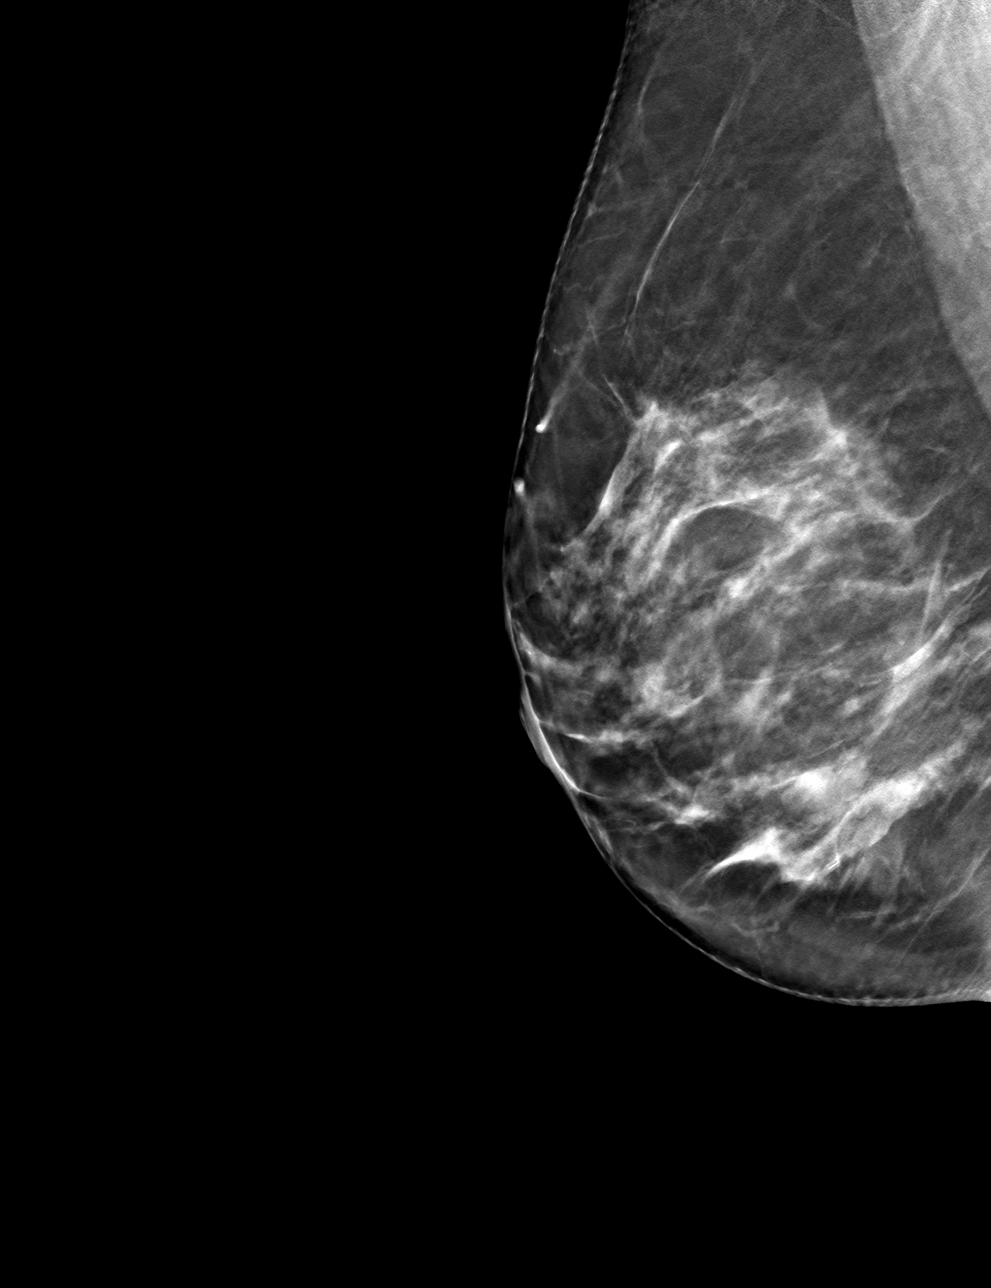

[4 of 12 positions shown; findings below may reference images not displayed]

ACR Breast Density Category ACR Breast Density Category c: The
breast tissue is heterogeneously dense, which may obscure small
masses.
FINDINGS: There is a persistent oval, circumscribed equal density mass in the
subareolar right breast. Further evaluation with ultrasound was
performed.

Targeted ultrasound is performed, showing an oval, circumscribed
completely anechoic mass at the 3 o'clock retroareolar position. It
measures 10 x 8 x 5 mm. There is no associated vascularity. This
correlates well with the mammographic finding and is consistent with
a benign simple cyst.
IMPRESSION: Benign simple cyst corresponding with the screening mammographic
findings. No further imaging follow-up required.

RECOMMENDATION:
Screening mammogram in one year.(Code:[id number])

I have discussed the findings and recommendations with the patient.
If applicable, a reminder letter will be sent to the patient
regarding the next appointment.

BI-RADS CATEGORY  2: Benign.

## 2021-11-24 DIAGNOSIS — K5904 Chronic idiopathic constipation: Secondary | ICD-10-CM | POA: Insufficient documentation

## 2021-11-30 ENCOUNTER — Encounter: Payer: Self-pay | Admitting: Internal Medicine

## 2021-11-30 ENCOUNTER — Ambulatory Visit (INDEPENDENT_AMBULATORY_CARE_PROVIDER_SITE_OTHER): Payer: BC Managed Care – PPO | Admitting: Internal Medicine

## 2021-11-30 VITALS — BP 108/74 | HR 84 | Temp 98.0°F | Resp 16 | Ht 69.0 in | Wt 155.0 lb

## 2021-11-30 DIAGNOSIS — R21 Rash and other nonspecific skin eruption: Secondary | ICD-10-CM

## 2021-11-30 DIAGNOSIS — F419 Anxiety disorder, unspecified: Secondary | ICD-10-CM

## 2021-11-30 DIAGNOSIS — Z23 Encounter for immunization: Secondary | ICD-10-CM

## 2021-11-30 MED ORDER — HYDROCORTISONE 2.5 % EX CREA: TOPICAL_CREAM | Freq: Two times a day (BID) | CUTANEOUS | 0 refills | Status: DC

## 2021-11-30 MED ORDER — SERTRALINE HCL 50 MG PO TABS: ORAL_TABLET | ORAL | 3 refills | Status: DC

## 2021-11-30 NOTE — Patient Instructions (Signed)
Start sertraline 50 mg: 1 tablet daily for 10 days. Then increase to 2 tablets daily  Apply the cream twice a day for 10 days.  See a counselor  Remain active      GO TO THE FRONT DESK, PLEASE SCHEDULE YOUR APPOINTMENTS Come back for   a checkup in 2 months, virtual okay.

## 2021-11-30 NOTE — Progress Notes (Signed)
Subjective:    Patient ID: Kaitlyn Gallagher, female    DOB: 1971-03-06, 50 y.o.   MRN: [id number]  DOS:  11/30/2021 Type of visit - description: Acute  The patient started the process of divorce from her husband about a year ago. Obviously she had on and off anxiety and some depression. The process is ending, she is feeling well except for having episodes of anxiety at times , sxs triggered by issues not necessarily   related to the divorce. Medication?  Also, has a rash behind the right ear.  Occasional itching at the palms but denies any lip or tongue swelling, no soles itching  Review of Systems Occasional insomnia. No depression. No nausea or vomiting. Occasional palpitations when she is anxious.  No chest pain  Past Medical History:  Diagnosis Date   Asthma    Back pain    GERD (gastroesophageal reflux disease)    Seasonal allergies     Past Surgical History:  Procedure Laterality Date   uterine polyp removal       Allergies as of 11/30/2021       Reactions   Gadolinium Derivatives Hives   Pt complained at end of study about itching on left side at waistline. Only one visible hive, pt was seen by dr Mosetta Putt. He felt since so slight, no benadryl given, pt stayed at office for extra 20 minutes to be checked.        Medication List        Accurate as of November 30, 2021  3:30 PM. If you have any questions, ask your nurse or doctor.          acetaminophen 325 MG tablet Commonly known as: TYLENOL Take 650 mg by mouth every 6 (six) hours as needed for pain (if needed).   albuterol 108 (90 Base) MCG/ACT inhaler Commonly known as: ProAir HFA Inhale 2 puffs into the lungs every 6 (six) hours as needed.   ibuprofen 200 MG tablet Commonly known as: ADVIL Take 200 mg by mouth every 6 (six) hours as needed.   naproxen sodium 220 MG tablet Commonly known as: ALEVE Take 220 mg by mouth.           Objective:   Physical Exam BP 108/74 (BP Location:  Left Arm, Patient Position: Sitting, Cuff Size: Small)   Pulse 84   Temp 98 F (36.7 C) (Oral)   Resp 16   Ht 5\' 9"  (1.753 m)   Wt 155 lb (70.3 kg)   LMP 11/30/2021 (Exact Date)   SpO2 97%   BMI 22.89 kg/m  General:   Well developed, NAD, BMI noted. HEENT:  Normocephalic . Face symmetric, atraumatic Skin:  Very small rash at the bottom of the right ear.  Hands with no rash.  neurologic:  alert & oriented X3.  Speech normal, gait appropriate for age and unassisted Psych--  Cognition and judgment appear intact.  Cooperative with normal attention span and concentration.  Behavior appropriate. Minimal anxiety noted, no depressed appearing.      Assessment     Assessment Allergic rhinitis GERD (h/o GI eval ~ 2005, patient reports she was diagnosed with dysmotility?) History of asthma Not on BCP Covid infex 04-2021, no treatment   PLAN Anxiety: As described above, no depression, minimal insomnia. Related to getting divorced.  Fortunately the process is going okay. In the past sertraline helped significantly without major side effect. Plan: Sertraline, see AVS. Recommend to stay active, she also sees a therapist  on and off, encouraged to continue with that. Reassess in 2 months, virtual visit okay. Rash: Near the ear, likely atopic dermatitis, prescribed hydrocortisone twice daily. RTC 2 months  This visit occurred during the SARS-CoV-2 public health emergency.  Safety protocols were in place, including screening questions prior to the visit, additional usage of staff PPE, and extensive cleaning of exam room while observing appropriate contact time as indicated for disinfecting solutions.

## 2021-12-01 NOTE — Assessment & Plan Note (Signed)
Anxiety: As described above, no depression, minimal insomnia. Related to getting divorced.  Fortunately the process is going okay. In the past sertraline helped significantly without major side effect. Plan: Sertraline, see AVS. Recommend to stay active, she also sees a therapist on and off, encouraged to continue with that. Reassess in 2 months, virtual visit okay. Rash: Near the ear, likely atopic dermatitis, prescribed hydrocortisone twice daily. RTC 2 months

## 2021-12-24 ENCOUNTER — Other Ambulatory Visit: Payer: Self-pay | Admitting: Internal Medicine

## 2022-02-18 ENCOUNTER — Encounter: Payer: Self-pay | Admitting: Internal Medicine

## 2022-05-06 ENCOUNTER — Ambulatory Visit (INDEPENDENT_AMBULATORY_CARE_PROVIDER_SITE_OTHER): Payer: BC Managed Care – PPO | Admitting: Family Medicine

## 2022-05-06 ENCOUNTER — Encounter: Payer: Self-pay | Admitting: Family Medicine

## 2022-05-06 VITALS — BP 93/62 | HR 66 | Ht 69.0 in | Wt 158.8 lb

## 2022-05-06 DIAGNOSIS — R21 Rash and other nonspecific skin eruption: Secondary | ICD-10-CM

## 2022-05-06 MED ORDER — LEVOCETIRIZINE DIHYDROCHLORIDE 5 MG PO TABS: 5.0000 mg | ORAL_TABLET | Freq: Every evening | ORAL | 1 refills | Status: DC

## 2022-05-06 MED ORDER — DESONIDE 0.05 % EX CREA: TOPICAL_CREAM | Freq: Two times a day (BID) | CUTANEOUS | 0 refills | Status: DC

## 2022-05-06 NOTE — Patient Instructions (Signed)
For the rash around your eyes: Stop the Zyrtec, start the Xyzal (levocetirizine) Refilling the desonide - use sparingly (no more than 10 days per month). Continue with Aquaphor.  Referral to allergist to further investigate triggers.

## 2022-05-06 NOTE — Progress Notes (Signed)
Acute Office Visit  Subjective:     Patient ID: Kaitlyn Gallagher, female    DOB: 1971-02-05, 51 y.o.   MRN: [id number]  CC: eye irritation   HPI Patient is in today for eye irritation/allergies.   Patient reports about a year ago she started having some flaky/itchy/dry skin around both of her eyes.  She went to dermatologist and was started on desonide which worked great.  They told her she could not use it anymore and changed her to Elidel cream which also seem to help for a while.  States that every time she stops using the steroid cream the irritation flares up.  Reports it is very itchy and tends to make her eyes puffy all around.  No conjunctivitis, eye drainage, foreign object sensation.  States she has not been using any new products, she is unsure if there is any food allergies -she has noticed some oral irritation with cherries and apple peeling's.  Reports she has to use Aquaphor multiple times throughout the day to keep her eye irritation settle down.  She has been taking Zyrtec since last fall which seems to help somewhat with allergies but is not making sense with her eye irritation.  She does recall being on Xyzal at one time and thinks that may have helped these eye symptoms.  She denies any history of eczema.  Reports seasonal allergies.  Reports childhood asthma.   ROS All review of systems negative except what is listed in the HPI      Objective:    BP 93/62   Pulse 66   Ht 5\' 9"  (1.753 m)   Wt 158 lb 12.8 oz (72 kg)   BMI 23.45 kg/m    Physical Exam Vitals reviewed.  Constitutional:      General: She is not in acute distress.    Appearance: Normal appearance. She is normal weight. She is not ill-appearing.  Eyes:     General:        Right eye: No discharge.        Left eye: No discharge.     Extraocular Movements: Extraocular movements intact.     Conjunctiva/sclera: Conjunctivae normal.     Comments: Mild erythema and dry skin noted around both  eyes, mildly "puffy" but no significant inflammation, no drainage or abrasions   Skin:    General: Skin is warm and dry.  Neurological:     General: No focal deficit present.     Mental Status: She is alert and oriented to person, place, and time. Mental status is at baseline.  Psychiatric:        Mood and Affect: Mood normal.        Thought Content: Thought content normal.        Judgment: Judgment normal.    No results found for any visits on 05/06/22.      Assessment & Plan:   1. Rash Stop the Zyrtec, start the Xyzal (levocetirizine) Refilling the desonide - use sparingly (no more than 10 days per month). Continue with Aquaphor.  Referral to allergist to further investigate triggers.  Patient aware of signs/symptoms requiring further/urgent evaluation.  - levocetirizine (XYZAL) 5 MG tablet; Take 1 tablet (5 mg total) by mouth every evening.  Dispense: 90 tablet; Refill: 1 - desonide (DESOWEN) 0.05 % cream; Apply topically 2 (two) times daily.  Dispense: 30 g; Refill: 0 - Ambulatory referral to Allergy   Return if symptoms worsen or fail to improve.  05/08/22  Olevia Bowens, NP

## 2022-05-31 ENCOUNTER — Other Ambulatory Visit: Payer: Self-pay | Admitting: Obstetrics and Gynecology

## 2022-05-31 DIAGNOSIS — Z1231 Encounter for screening mammogram for malignant neoplasm of breast: Secondary | ICD-10-CM

## 2022-06-01 DIAGNOSIS — L292 Pruritus vulvae: Secondary | ICD-10-CM | POA: Diagnosis not present

## 2022-06-01 DIAGNOSIS — Z6823 Body mass index (BMI) 23.0-23.9, adult: Secondary | ICD-10-CM | POA: Diagnosis not present

## 2022-06-01 DIAGNOSIS — Z01419 Encounter for gynecological examination (general) (routine) without abnormal findings: Secondary | ICD-10-CM | POA: Diagnosis not present

## 2022-06-01 DIAGNOSIS — N760 Acute vaginitis: Secondary | ICD-10-CM | POA: Diagnosis not present

## 2022-06-01 DIAGNOSIS — K649 Unspecified hemorrhoids: Secondary | ICD-10-CM | POA: Diagnosis not present

## 2022-06-01 DIAGNOSIS — R319 Hematuria, unspecified: Secondary | ICD-10-CM | POA: Diagnosis not present

## 2022-06-07 ENCOUNTER — Ambulatory Visit: Payer: BC Managed Care – PPO

## 2022-06-17 ENCOUNTER — Ambulatory Visit: Payer: BC Managed Care – PPO

## 2022-06-21 ENCOUNTER — Ambulatory Visit
Admission: RE | Admit: 2022-06-21 | Discharge: 2022-06-21 | Disposition: A | Discharge status: Home / Self Care | Payer: BC Managed Care – PPO | Source: Ambulatory Visit | Attending: Obstetrics and Gynecology | Admitting: Obstetrics and Gynecology

## 2022-06-21 DIAGNOSIS — Z1231 Encounter for screening mammogram for malignant neoplasm of breast: Secondary | ICD-10-CM | POA: Diagnosis not present

## 2022-06-23 ENCOUNTER — Ambulatory Visit: Payer: BC Managed Care – PPO

## 2022-07-09 ENCOUNTER — Ambulatory Visit (INDEPENDENT_AMBULATORY_CARE_PROVIDER_SITE_OTHER): Payer: BC Managed Care – PPO | Admitting: Internal Medicine

## 2022-07-09 ENCOUNTER — Encounter: Payer: Self-pay | Admitting: Internal Medicine

## 2022-07-09 VITALS — BP 108/68 | HR 67 | Temp 98.3°F | Resp 16 | Ht 69.0 in | Wt 158.2 lb

## 2022-07-09 DIAGNOSIS — Z0000 Encounter for general adult medical examination without abnormal findings: Secondary | ICD-10-CM

## 2022-07-09 DIAGNOSIS — Z1159 Encounter for screening for other viral diseases: Secondary | ICD-10-CM | POA: Diagnosis not present

## 2022-07-09 NOTE — Progress Notes (Unsigned)
   Subjective:    Patient ID: Kaitlyn Gallagher, female    DOB: [birth year], 51 y.o.   MRN: [id number]  DOS:  07/09/2022 Type of visit - description: cpx  Since the last office visit is doing well. Does have some issues with atopic dermatitis Emotionally doing well  Review of Systems See above   Past Medical History:  Diagnosis Date   Asthma    Back pain    GERD (gastroesophageal reflux disease)    Seasonal allergies     Past Surgical History:  Procedure Laterality Date   BREAST BIOPSY Right 2019   uterine polyp removal       Current Outpatient Medications  Medication Instructions   acetaminophen (TYLENOL) 650 mg, Every 6 hours PRN   levocetirizine (XYZAL) 5 mg, Oral, Every evening       Objective:   Physical Exam BP 108/68   Pulse 67   Temp 98.3 F (36.8 C) (Oral)   Resp 16   Ht 5\' 9"  (1.753 m)   Wt 158 lb 4 oz (71.8 kg)   LMP 07/09/2022 (Exact Date)   SpO2 99%   BMI 23.37 kg/m  General: Well developed, NAD, BMI noted Neck: No  thyromegaly  HEENT:  Normocephalic . Face symmetric, atraumatic Lungs:  CTA B Normal respiratory effort, no intercostal retractions, no accessory muscle use. Heart: RRR,  no murmur.  Abdomen:  Not distended, soft, non-tender. No rebound or rigidity.   Lower extremities: no pretibial edema bilaterally  Skin: Eyelids bilaterally: Skin is slightly dry and scaly Neurologic:  alert & oriented X3.  Speech normal, gait appropriate for age and unassisted Strength symmetric and appropriate for age.  Psych: Cognition and judgment appear intact.  Cooperative with normal attention span and concentration.  Behavior appropriate. No anxious or depressed appearing.     Assessment     Assessment Allergic rhinitis, atopic dermatitis GERD (h/o GI eval ~ 2005, patient reports she was diagnosed with dysmotility?) History of asthma Not on BCP Covid infex 04-2021, no treatment   PLAN Here for CPX Atopic dermatitis: Having problems  with her eyelids for a while, to see the allergist soon. History of GERD: Not an issue at this point Anxiety: See last visit, related to separation from her husband.  The process continue, fortunately after the last visit she required sertraline only for few days.  She has a leftover and if she feels she needs it we will restart and let me know. RTC 1 year  -Td: 06/2018 -Covid vaccines: Bivalent booster recommended -Rec flu shot  q. year - sees gyn regularly.   -D/t FH breast ca reports she had a genetic testing and was told she is high risk for breast ca, recent mammogram negative, in previous year she told me the plan was to do an MRI every 2 years.  Recommend to discuss with gynecology. -CCS: + FH colon ca, father age at age 75.  (-) Hemoccults 2019.  s/p cscope 3/ 2021, Dr 2022, no polyps, 5 years per report  -Labs: CMP, FLP, CBC, hep C -Diet and exercise discussed.  Doing well  ====12

## 2022-07-09 NOTE — Patient Instructions (Addendum)
Recommend to proceed with covid booster (bivalent) at your pharmacy. Flu shot this fall.   Take vitamin D between 1000 and 2000 units daily   GO TO THE LAB : Get the blood work     GO TO THE FRONT DESK, PLEASE SCHEDULE YOUR APPOINTMENTS Come back for   a physical exam in 1 year

## 2022-07-11 ENCOUNTER — Encounter: Payer: Self-pay | Admitting: Internal Medicine

## 2022-07-11 NOTE — Assessment & Plan Note (Signed)
Here for CPX Atopic dermatitis: Having problems with her eyelids for a while, to see the allergist soon. History of GERD: Not an issue at this point Anxiety: See last visit, related to separation from her husband.  The process continue, fortunately after the last visit she required sertraline only for few days.  She has a leftover and if she feels the  need, will restart and let me know. RTC 1 year

## 2022-07-11 NOTE — Assessment & Plan Note (Signed)
-  Td: 06/2018 -Covid vaccines: Bivalent booster recommended -Rec flu shot  q. year - sees gyn regularly.   -D/t FH breast ca reports she had a genetic testing and was told she is high risk for breast ca, recent mammogram negative, in previous year she told me the plan was to do an MRI every 2 years.  Recommend to discuss with gynecology. -CCS: + FH colon ca, father age at age 49.  (-) Hemoccults 2019.  s/p cscope 3/ 2021, Dr Loreta Ave, no polyps, 5 years per report -Labs: CMP, FLP, CBC, hep C -Diet and exercise discussed.  Doing well

## 2022-07-12 LAB — CBC WITH DIFFERENTIAL/PLATELET
Absolute Monocytes: 848 cells/uL (ref 200–950)
Basophils Absolute: 95 cells/uL (ref 0–200)
Basophils Relative: 0.9 %
Eosinophils Absolute: 509 cells/uL — ABNORMAL HIGH (ref 15–500)
Eosinophils Relative: 4.8 %
HCT: 40.6 % (ref 35.0–45.0)
Hemoglobin: 14.1 g/dL (ref 11.7–15.5)
Lymphs Abs: 2480 cells/uL (ref 850–3900)
MCH: 30.9 pg (ref 27.0–33.0)
MCHC: 34.7 g/dL (ref 32.0–36.0)
MCV: 88.8 fL (ref 80.0–100.0)
MPV: 10.7 fL (ref 7.5–12.5)
Monocytes Relative: 8 %
Neutro Abs: 6667 cells/uL (ref 1500–7800)
Neutrophils Relative %: 62.9 %
Platelets: 326 10*3/uL (ref 140–400)
RBC: 4.57 10*6/uL (ref 3.80–5.10)
RDW: 12 % (ref 11.0–15.0)
Total Lymphocyte: 23.4 %
WBC: 10.6 10*3/uL (ref 3.8–10.8)

## 2022-07-12 LAB — COMPREHENSIVE METABOLIC PANEL
AG Ratio: 1.6 (calc) (ref 1.0–2.5)
ALT: 7 U/L (ref 6–29)
AST: 17 U/L (ref 10–35)
Albumin: 4.2 g/dL (ref 3.6–5.1)
Alkaline phosphatase (APISO): 40 U/L (ref 31–125)
BUN: 17 mg/dL (ref 7–25)
CO2: 24 mmol/L (ref 20–32)
Calcium: 9.4 mg/dL (ref 8.6–10.2)
Chloride: 101 mmol/L (ref 98–110)
Creat: 0.82 mg/dL (ref 0.50–0.99)
Globulin: 2.7 g/dL (calc) (ref 1.9–3.7)
Glucose, Bld: 85 mg/dL (ref 65–99)
Potassium: 4 mmol/L (ref 3.5–5.3)
Sodium: 137 mmol/L (ref 135–146)
Total Bilirubin: 0.5 mg/dL (ref 0.2–1.2)
Total Protein: 6.9 g/dL (ref 6.1–8.1)

## 2022-07-12 LAB — LIPID PANEL
Cholesterol: 181 mg/dL (ref <200)
HDL: 71 mg/dL (ref >=50)
LDL Cholesterol (Calc): 92 mg/dL (calc)
Non-HDL Cholesterol (Calc): 110 mg/dL (calc) (ref <130)
Total CHOL/HDL Ratio: 2.5 (calc) (ref <5.0)
Triglycerides: 88 mg/dL (ref <150)

## 2022-07-12 LAB — HEPATITIS C ANTIBODY: Hepatitis C Ab: NONREACTIVE

## 2022-07-13 ENCOUNTER — Ambulatory Visit: Payer: Self-pay | Admitting: Allergy & Immunology

## 2022-07-14 ENCOUNTER — Other Ambulatory Visit: Payer: Self-pay | Admitting: Obstetrics and Gynecology

## 2022-07-14 DIAGNOSIS — Z9189 Other specified personal risk factors, not elsewhere classified: Secondary | ICD-10-CM

## 2022-07-21 ENCOUNTER — Ambulatory Visit
Admission: RE | Admit: 2022-07-21 | Discharge: 2022-07-21 | Disposition: A | Discharge status: Home / Self Care | Payer: BC Managed Care – PPO | Source: Ambulatory Visit | Attending: Obstetrics and Gynecology | Admitting: Obstetrics and Gynecology

## 2022-07-21 DIAGNOSIS — Z9189 Other specified personal risk factors, not elsewhere classified: Secondary | ICD-10-CM

## 2022-07-27 ENCOUNTER — Ambulatory Visit: Payer: Self-pay | Admitting: Allergy & Immunology

## 2022-08-11 ENCOUNTER — Encounter: Payer: Self-pay | Admitting: Allergy

## 2022-08-11 ENCOUNTER — Ambulatory Visit (INDEPENDENT_AMBULATORY_CARE_PROVIDER_SITE_OTHER): Payer: BC Managed Care – PPO | Admitting: Allergy

## 2022-08-11 ENCOUNTER — Other Ambulatory Visit: Payer: Self-pay | Admitting: Allergy

## 2022-08-11 VITALS — BP 124/78 | HR 74 | Resp 16 | Ht 69.0 in | Wt 154.4 lb

## 2022-08-11 DIAGNOSIS — T781XXD Other adverse food reactions, not elsewhere classified, subsequent encounter: Secondary | ICD-10-CM

## 2022-08-11 DIAGNOSIS — H01131 Eczematous dermatitis of right upper eyelid: Secondary | ICD-10-CM

## 2022-08-11 DIAGNOSIS — H01132 Eczematous dermatitis of right lower eyelid: Secondary | ICD-10-CM | POA: Diagnosis not present

## 2022-08-11 DIAGNOSIS — H01134 Eczematous dermatitis of left upper eyelid: Secondary | ICD-10-CM

## 2022-08-11 DIAGNOSIS — J3089 Other allergic rhinitis: Secondary | ICD-10-CM

## 2022-08-11 DIAGNOSIS — T781XXA Other adverse food reactions, not elsewhere classified, initial encounter: Secondary | ICD-10-CM | POA: Diagnosis not present

## 2022-08-11 DIAGNOSIS — H01135 Eczematous dermatitis of left lower eyelid: Secondary | ICD-10-CM

## 2022-08-11 DIAGNOSIS — L309 Dermatitis, unspecified: Secondary | ICD-10-CM

## 2022-08-11 MED ORDER — OPZELURA 1.5 % EX CREA: 1.0000 | TOPICAL_CREAM | Freq: Two times a day (BID) | CUTANEOUS | 5 refills | Status: DC | PRN

## 2022-08-11 NOTE — Patient Instructions (Addendum)
-  environmental allergy testing is positive to weed pollen, tree pollen, dust mite, horse.  Allergen avoidance measures provided.  -food allergy testing is positive to celery.  Discussed avoiding celery in diet for next 2-4 weeks and see if you note improvement in symptoms.  If you do not see any improvements then this is likely a sensitized food and you can reintroduce back into diet.  If you do note improvement in symptoms then would continue avoidance.  -recommend you continue to avoid products positive on previous patch testing - Levocetirizine does not seem to be effective thus would recommend stopping use.  Can try either Zyrtec or Allegra as alternatives -can continue Flonase 2 sprays each nostril daily for 1-2 weeks at a time before stopping once nasal congestion improves for maximum benefit -can use Desonide twice a day as needed for flared areas (red, irritated, dry, itchy, patchy, scaly, flaky) only.  This is a steroid.  -recommend use of non-steroid ointment, Opzelura, twice a day needed for flared areas (red, irritated, dry, itchy, patchy, scaly, flaky) only.  This is a non-steroid ointment. -continue moisturization daily at least with your vanicream -the oral allergy syndrome (OAS) or pollen-food allergy syndrome (PFAS) is a relatively common form of food allergy, particularly in adults. It typically occurs in people who have pollen allergies when the immune system "sees" proteins on the food that look like proteins on the pollen. This results in the allergy antibody (IgE) binding to the food instead of the pollen. Patients typically report itching and/or mild swelling of the mouth and throat immediately following ingestion of certain uncooked fruits (including nuts) or raw vegetables. Only a very small number of affected individuals experience systemic allergic reactions, such as anaphylaxis which occurs with true food allergies.    - we have discussed the following in regards to foods:    Allergy: food allergy is when you have eaten a food, developed an allergic reaction after eating the food and have IgE to the food (positive food testing either by skin testing or blood testing).  Food allergy could lead to life threatening symptoms  Sensitivity: occurs when you have IgE to a food (positive food testing either by skin testing or blood testing) but is a food you eat without any issues.  This is not an allergy and we recommend keeping the food in the diet  Intolerance: this is when you have negative testing by either skin testing or blood testing thus not allergic but the food causes symptoms (like belly pain, bloating, diarrhea etc) with ingestion.  These foods should be avoided to prevent symptoms.    Follow-up in 3 months or sooner if needed

## 2022-08-11 NOTE — Progress Notes (Signed)
New Patient Note  RE: Kaitlyn Gallagher MRN: [id number] DOB: 06/07/1971 Date of Office Visit: 08/11/2022  Primary care provider: Wanda Plump, MD  Chief Complaint: rash  History of present illness: Kaitlyn Gallagher is a 51 y.o. female presenting today for evaluation of rash.   She has been having a rash around the eyes and in armpits since Nov '22.  She states the area around the eyes gets itchy as well as her armpit area.  The area around the eyes gets red, flaky/scaly. skin of the armpits are getting darker.  She has had laser hair removal so should not have any shadowing from hair there.  She has been using the same deodorant which she has been using for a long time. She uses ice pack on the eyes to help. Uses vanicream moisturizer and cerava face wash.  She saw a dermatologist who told her to stop using makeup, nail polish etc. she was prescribed desonide to use that does help but because it is a steroid she does not want to use this all the time.  She has had the symptoms even on vacation or away from the home.  She states she was told by her PCP that it could be food allergy.  She is thinking it is something she may be eating frequently.  She had this similar rash about 2 years ago as well.  She has been taking levocetirizine 1 tablet daily does not feel it is helpful for the itch or rash. She states had patch testing done at [facility] 2 to 3 years ago or so.  She states nickel was positive as well as an ingredient found in make-up sponges.   She also reports having nasal congestion and Other allergy symptoms like itchy watery eyes.  She does not feel that the levocetirizine helps with the symptoms either.  She does use Flonase that she states is helpful for the congestion.  She states most of her family have history of environmental allergy. She does note foods like peaches or apples or fruits of peels that make her mouth itchy.  She states that these foods are cooked she does  not have an issue.  Review of systems: Review of Systems  Constitutional: Negative.   HENT: Negative.    Eyes: Negative.   Respiratory: Negative.    Cardiovascular: Negative.   Gastrointestinal: Negative.   Musculoskeletal: Negative.   Skin:  Positive for rash.  Allergic/Immunologic: Negative.   Neurological: Negative.     All other systems negative unless noted above in HPI  Past medical history: Past Medical History:  Diagnosis Date   Asthma    Back pain    GERD (gastroesophageal reflux disease)    Seasonal allergies     Past surgical history: Past Surgical History:  Procedure Laterality Date   BREAST BIOPSY Right 2019   uterine polyp removal       Family history:  Family History  Problem Relation Age of Onset   Osteoarthritis Mother    Colon cancer Father        24   Prostate cancer Father    Breast cancer Paternal Aunt    Breast cancer Paternal Aunt    Diabetes Neg Hx    CAD Neg Hx     Social history: Lives in a home without carpeting with gas heating and central cooling.  There is a cat in the home.  There is no concern for water damage, mildew or roaches in  the home.  She is an Programmer, systems.  She has no smoking history.   Medication List: Current Outpatient Medications  Medication Sig Dispense Refill   desonide (DESOWEN) 0.05 % cream Apply twice a day on affected areas for one week, then once a day for one more week and stop it     levocetirizine (XYZAL) 5 MG tablet Take 1 tablet (5 mg total) by mouth every evening. 90 tablet 1   No current facility-administered medications for this visit.    Known medication allergies: Allergies  Allergen Reactions   Gadolinium Derivatives Hives    Pt complained at end of study about itching on left side at waistline. Only one visible hive, pt was seen by dr Mosetta Putt. He felt since so slight, no benadryl given, pt stayed at office for extra 20 minutes to be checked. Pt needs to take 50 mg of benadryl 1 hr prior to mri  scan, per dr. Karin Golden.      Physical examination: Blood pressure 124/78, pulse 74, resp. rate 16, height 5\' 9"  (1.753 m), weight 154 lb 6 oz (70 kg), SpO2 98 %.  General: Alert, interactive, in no acute distress. HEENT: PERRLA, TMs pearly gray, turbinates non-edematous without discharge, post-pharynx non erythematous. Neck: Supple without lymphadenopathy. Lungs: Clear to auscultation without wheezing, rhonchi or rales. {no increased work of breathing. CV: Normal S1, S2 without murmurs. Abdomen: Nondistended, nontender. Skin: Upper and lower eyelid with mild erythema with slight scaling . Extremities:  No clubbing, cyanosis or edema. Neuro:   Grossly intact.  Diagnositics/Labs:  Allergy testing:   Airborne Adult Perc - 08/11/22 1400     Time Antigen Placed 1431    Allergen Manufacturer 08/13/22    Location Back    Number of Test 59    1. Control-Buffer 50% Glycerol Negative    2. Control-Histamine 1 mg/ml 2+    3. Albumin saline Negative    4. Bahia Negative    5. Waynette Buttery Negative    6. Johnson Negative    7. [place] Blue Negative    8. Meadow Fescue Negative    9. Perennial Rye Negative    10. Sweet Vernal Negative    11. Timothy Negative    12. Cocklebur Negative    13. Burweed Marshelder Negative    14. Ragweed, short Negative    15. Ragweed, Giant Negative    16. Plantain,  English Negative    17. Lamb's Quarters Negative    18. Sheep Sorrell Negative    19. Rough Pigweed 2+    20. Marsh Elder, Rough Negative    21. Mugwort, Common Negative    22. Ash mix Negative    23. Birch mix Negative    24. Beech American 2+    25. Box, Elder Negative    26. Cedar, red Negative    27. Cottonwood, [place] Negative    28. Elm mix 2+    29. Hickory Negative    30. Maple mix Negative    31. Oak, Guinea[place] mix Negative    32. Pecan Pollen 3+    33. Pine mix Negative    34. Sycamore Eastern Negative    35. Walnut, Black Pollen Negative    36. Alternaria alternata Negative     37. Cladosporium Herbarum Negative    38. Aspergillus mix Negative    39. Penicillium mix Negative    40. Bipolaris sorokiniana (Helminthosporium) Negative    41. Drechslera spicifera (Curvularia) Negative    42. Mucor plumbeus Negative  43. Fusarium moniliforme Negative    44. Aureobasidium pullulans (pullulara) Negative    45. Rhizopus oryzae Negative    46. Botrytis cinera Negative    47. Epicoccum nigrum Negative    48. Phoma betae Negative    49. Candida Albicans Negative    50. Trichophyton mentagrophytes Negative    51. Mite, D Farinae  5,000 AU/ml Negative    52. Mite, D Pteronyssinus  5,000 AU/ml 2+    53. Cat Hair 10,000 BAU/ml Negative    54.  Dog Epithelia Negative    55. Mixed Feathers Negative    56. Horse Epithelia 2+    57. Cockroach, German Negative    58. Mouse Negative    59. Tobacco Leaf Negative             Food Adult Perc - 08/11/22 1400     Time Antigen Placed 1457    Allergen Manufacturer Lavella Hammock    Location Back    1. Peanut Negative    3. Wheat Negative    4. Sesame Negative    5. Milk, cow Negative    6. Egg White, Chicken Negative    7. Casein Negative    8. Shellfish Mix Negative    12. Kettering Negative    13. Almond Negative    14. Hazelnut Negative    16. Coconut Negative    21. Tuna Negative    22. Salmon Negative    23. Flounder Negative    25. Shrimp Negative    26. Crab Negative    31. Oat  Negative    33. Hops Negative    34. Rice Negative    35. Cottonseed Negative    36. Saccharomyces Cerevisiae  Negative    37. Pork Negative    38. [place] Meat Negative    40. Beef Negative    42. Tomato Negative    43. White Potato Negative    48. Avocado Negative    49. Onion Negative    51. Carrots Negative    52. Celery 2+    53. Corn Negative    54. Cucumber Negative    55. Grape (White seedless) Negative    56. Orange  Negative    57. Banana Negative    60. Strawberry Negative    62. Watermelon Negative    64.  Chocolate/Cacao bean Negative    65. Karaya Gum Negative    66. Acacia (Arabic Gum) Negative    67. Cinnamon Negative    68. Nutmeg Negative    70. Garlic Negative    71. Pepper, black Negative    72. Mustard Negative             Allergy testing results were read and interpreted by provider, documented by clinical staff.   Assessment and plan: Dermatitis of eyelids bilaterally Dermatitis of axilla Allergic rhinitis Oral allergy syndrome  -environmental allergy testing is positive to weed pollen, tree pollen, dust mite, horse.  Allergen avoidance measures provided.  -food allergy testing is positive to celery.  Discussed avoiding celery in diet for next 2-4 weeks and see if you note improvement in symptoms.  If you do not see any improvements then this is likely a sensitized food and you can reintroduce back into diet.  If you do note improvement in symptoms then would continue avoidance.  -recommend you continue to avoid products positive on previous patch testing - Levocetirizine does not seem to be effective thus would recommend stopping use.  Can try either Zyrtec or Allegra as alternatives -can continue Flonase 2 sprays each nostril daily for 1-2 weeks at a time before stopping once nasal congestion improves for maximum benefit -can use Desonide twice a day as needed for flared areas (red, irritated, dry, itchy, patchy, scaly, flaky) only.  This is a steroid.  -recommend use of non-steroid ointment, Opzelura, twice a day needed for flared areas (red, irritated, dry, itchy, patchy, scaly, flaky) only.  This is a non-steroid ointment. -continue moisturization daily at least with your vanicream -the oral allergy syndrome (OAS) or pollen-food allergy syndrome (PFAS) is a relatively common form of food allergy, particularly in adults. It typically occurs in people who have pollen allergies when the immune system "sees" proteins on the food that look like proteins on the pollen. This  results in the allergy antibody (IgE) binding to the food instead of the pollen. Patients typically report itching and/or mild swelling of the mouth and throat immediately following ingestion of certain uncooked fruits (including nuts) or raw vegetables. Only a very small number of affected individuals experience systemic allergic reactions, such as anaphylaxis which occurs with true food allergies.    Follow-up in 3 months or sooner if needed  I appreciate the opportunity to take part in Shiquita's care. Please do not hesitate to contact me with questions.  Sincerely,   Margo Aye, MD Allergy/Immunology [facility]

## 2022-08-15 ENCOUNTER — Ambulatory Visit
Admission: RE | Admit: 2022-08-15 | Discharge: 2022-08-15 | Disposition: A | Discharge status: Home / Self Care | Payer: BC Managed Care – PPO | Source: Ambulatory Visit | Attending: Obstetrics and Gynecology | Admitting: Obstetrics and Gynecology

## 2022-08-15 DIAGNOSIS — Z1239 Encounter for other screening for malignant neoplasm of breast: Secondary | ICD-10-CM | POA: Diagnosis not present

## 2022-08-15 MED ORDER — GADOBUTROL 1 MMOL/ML IV SOLN
7.0000 mL | Freq: Once | INTRAVENOUS | Status: AC | PRN
  Administered 2022-08-15: 7 mL via INTRAVENOUS

## 2022-08-18 ENCOUNTER — Other Ambulatory Visit: Payer: BC Managed Care – PPO

## 2022-12-17 ENCOUNTER — Telehealth: Payer: Self-pay | Admitting: Internal Medicine

## 2022-12-17 NOTE — Telephone Encounter (Signed)
Pt called stating that she has pink eye and was wondering if Dr. Drue Novel could call something in to help treat it.

## 2022-12-17 NOTE — Telephone Encounter (Signed)
Needs appt- recommend e-visit.

## 2022-12-17 NOTE — Telephone Encounter (Signed)
Pt called and advised to visit UC or schedule CTH appt

## 2022-12-30 ENCOUNTER — Encounter: Payer: Self-pay | Admitting: Internal Medicine

## 2022-12-30 DIAGNOSIS — Z0100 Encounter for examination of eyes and vision without abnormal findings: Secondary | ICD-10-CM

## 2023-02-11 ENCOUNTER — Encounter: Payer: Self-pay | Admitting: Internal Medicine

## 2023-02-11 ENCOUNTER — Ambulatory Visit: Payer: BC Managed Care – PPO | Admitting: Internal Medicine

## 2023-02-11 VITALS — BP 116/72 | HR 62 | Temp 98.1°F | Resp 16 | Ht 69.0 in | Wt 145.4 lb

## 2023-02-11 DIAGNOSIS — R202 Paresthesia of skin: Secondary | ICD-10-CM | POA: Diagnosis not present

## 2023-02-11 MED ORDER — ALBUTEROL SULFATE HFA 108 (90 BASE) MCG/ACT IN AERS
2.0000 | INHALATION_SPRAY | Freq: Four times a day (QID) | RESPIRATORY_TRACT | 5 refills | Status: AC | PRN
Start: 2023-02-11 — End: ?

## 2023-02-11 NOTE — Progress Notes (Unsigned)
   Subjective:    Patient ID: Kaitlyn Gallagher, female    DOB: [birth year], 52 y.o.   MRN: [id number]  DOS:  02/11/2023 Type of visit - description: Acute  CTS?  Patient reports 1 month history of tingling at the radial aspect of the right hand.  Initially only at the hand but then the sensation travel up to her shoulder sometimes. She had CTS before, using a brace.  No help. Denies neck pain  In addition, a couple of days ago become aware that she is unable to move her fifth toe.  No injury, no swelling, no back pain.  No back pain  She denies headache, visual disturbances, dizziness. In addition has a ill-defined paresthesias at the right leg lateral side of the face bilaterally.  Review of Systems See above   Past Medical History:  Diagnosis Date   Asthma    Back pain    GERD (gastroesophageal reflux disease)    Seasonal allergies     Past Surgical History:  Procedure Laterality Date   BREAST BIOPSY Right 2019   uterine polyp removal       Current Outpatient Medications  Medication Instructions   albuterol (VENTOLIN HFA) 108 (90 Base) MCG/ACT inhaler 2 puffs, Inhalation, Every 6 hours PRN   desonide (DESOWEN) 0.05 % cream Apply twice a day on affected areas for one week, then once a day for one more week and stop it   levocetirizine (XYZAL) 5 mg, Oral, Every evening   OPZELURA 1.5 % CREA 1 Application, Topical, 2 times daily PRN       Objective:   Physical Exam BP 116/72   Pulse 62   Temp 98.1 F (36.7 C) (Oral)   Resp 16   Ht 5' 9"$  (1.753 m)   Wt 145 lb 6 oz (65.9 kg)   LMP 01/14/2023 (Approximate)   SpO2 99%   BMI 21.47 kg/m  General:   Well developed, NAD, BMI noted. HEENT:  Normocephalic . Face symmetric, atraumatic Lungs:  CTA B Normal respiratory effort, no intercostal retractions, no accessory muscle use. Heart: RRR,  no murmur.  Lower extremities: no pretibial edema bilaterally  Skin: Not pale. Not jaundice Neurologic:  alert & oriented  X3.  Speech normal, gait appropriate for age and unassisted. EOMI, pupils equal reactive. DTRs and motor symmetric except for no motor strength on today left fifth toe. Psych--  Cognition and judgment appear intact.  Cooperative with normal attention span and concentration.  Behavior appropriate. No anxious or depressed appearing.      Assessment     Assessment Allergic rhinitis, atopic dermatitis GERD (h/o GI eval ~ 2005, patient reports she was diagnosed with dysmotility?) History of asthma Not on BCP Covid infex 04-2021, no treatment   PLAN Paresthesias: Paresthesias of the upper extremity, lower extremity, facial bilaterally.  Loss of motor function of left fifth toe. History of CTS, right hand, he is starting to wear her brace again about 4 weeks ago but that has not helped the paresthesias of the hand this time. Etiology not completely clear. Plan: For now continue using the CTS brace 123456, folic acid, TSH, RPR, HIV to rule out possible paresthesias etiology. Referred to neurology.  I wonder if she needs further eval.

## 2023-02-11 NOTE — Patient Instructions (Addendum)
We are referring you to neurology  GO TO THE LAB : Get the blood work     Use the carpal tunnel brace every night and if possible during the daytime  See you in June for your physical  Call anytime if needed

## 2023-02-12 NOTE — Assessment & Plan Note (Signed)
Paresthesias: new issue Paresthesias of the upper extremity, lower extremity, facial bilaterally.  Loss of motor function of left fifth toe. History of CTS, right hand, he is starting to wear her brace again about 4 weeks ago but that has not helped the paresthesias of the hand this time. Etiology not completely clear. Plan: For now continue using the CTS brace 123456, folic acid, TSH, RPR, HIV to rule out possible paresthesias etiology. Referred to neurology.  I wonder if she needs further eval.

## 2023-02-14 LAB — HIV ANTIBODY (ROUTINE TESTING W REFLEX): HIV 1&2 Ab, 4th Generation: NONREACTIVE

## 2023-02-14 LAB — TSH: TSH: 0.55 mIU/L

## 2023-02-14 LAB — RPR: RPR Ser Ql: NONREACTIVE

## 2023-02-14 LAB — B12 AND FOLATE PANEL
Folate: 14.5 ng/mL
Vitamin B-12: 876 pg/mL (ref 200–1100)

## 2023-03-07 ENCOUNTER — Encounter: Payer: Self-pay | Admitting: Internal Medicine

## 2023-03-07 MED ORDER — SERTRALINE HCL 50 MG PO TABS: ORAL_TABLET | ORAL | 3 refills | Status: DC

## 2023-03-30 ENCOUNTER — Other Ambulatory Visit: Payer: Self-pay | Admitting: Internal Medicine

## 2023-04-07 ENCOUNTER — Ambulatory Visit: Payer: BC Managed Care – PPO | Admitting: Neurology

## 2023-04-07 ENCOUNTER — Encounter: Payer: Self-pay | Admitting: Neurology

## 2023-04-07 VITALS — BP 97/50 | HR 69 | Ht 69.0 in | Wt 143.0 lb

## 2023-04-07 DIAGNOSIS — R202 Paresthesia of skin: Secondary | ICD-10-CM | POA: Diagnosis not present

## 2023-04-07 MED ORDER — DULOXETINE HCL 60 MG PO CPEP: 60.0000 mg | ORAL_CAPSULE | Freq: Every day | ORAL | 3 refills | Status: DC

## 2023-04-07 MED ORDER — DULOXETINE HCL 30 MG PO CPEP: 30.0000 mg | ORAL_CAPSULE | Freq: Every day | ORAL | 0 refills | Status: DC

## 2023-04-07 NOTE — Progress Notes (Signed)
Chief Complaint  Patient presents with   New Patient (Initial Visit)    Rm 14,  Paresthesias tingling in fingers up arm on right side, more so numbness on the left side fingers through arm, she reports facial numbness as well, right leg gets goosebumps only not left when she gets cold, she reports unable to move pinky toe on the left. She reported the tingling started January       ASSESSMENT AND PLAN  Merial Moritz is a 52 y.o. female Intermittent paresthesia Anxiety  Cymbalta 30 mg, titrating to 60 mg daily  EMG nerve conduction study to rule out focal neuropathy, such as carpal tunnel syndrome   DIAGNOSTIC DATA (LABS, IMAGING, TESTING) - I reviewed patient records, labs, notes, testing and imaging myself where available.   MEDICAL HISTORY:  Shigeko Manard, is a 52 year old female, seen in request by her primary care physician Dr. Drue Novel, [facility] for evaluation of intermittent paresthesia, initial evaluation was on April 07, 2023   I reviewed and summarized the referring note. PMHX.  She suffered anxiety, went through divorce 2023, since beginning of 2024, she noticed intermittent numbness tingling, mostly involving bilateral hands, right worse than left, first 3 fingers, sometimes woke her up from sleep,  Also noticed intermittent bilateral toes paresthesia  She described previously when she was experiencing intense anxiety, she does have paresthesia involving limbs and face, she denies gait abnormality, was given the prescription of Zoloft, but did not take it   PHYSICAL EXAM:   Vitals:   04/07/23 0818  BP: (!) 97/50  Pulse: 69  Weight: 143 lb (64.9 kg)  Height:  (1.753 m)   Body mass index is 21.12 kg/m.  PHYSICAL EXAMNIATION:  Gen: NAD, conversant, well nourised, well groomed                     Cardiovascular: Regular rate rhythm, no peripheral edema, warm, nontender. Eyes: Conjunctivae clear without exudates or hemorrhage Neck: Supple, no  carotid bruits. Pulmonary: Clear to auscultation bilaterally   NEUROLOGICAL EXAM:  MENTAL STATUS: Speech/cognition: Awake, alert, oriented to history taking and casual conversation CRANIAL NERVES: CN II: Visual fields are full to confrontation. Pupils are round equal and briskly reactive to light. CN III, IV, VI: extraocular movement are normal. No ptosis. CN V: Facial sensation is intact to light touch CN VII: Face is symmetric with normal eye closure  CN VIII: Hearing is normal to causal conversation. CN IX, X: Phonation is normal. CN XI: Head turning and shoulder shrug are intact  MOTOR: There is no pronator drift of out-stretched arms. Muscle bulk and tone are normal. Muscle strength is normal.  REFLEXES: Reflexes are 2+ and symmetric at the biceps, triceps, knees, and ankles. Plantar responses are flexor.  SENSORY: Intact to light touch, pinprick and vibratory sensation are intact in fingers and toes.  COORDINATION: There is no trunk or limb dysmetria noted.  GAIT/STANCE: Posture is normal. Gait is steady with normal steps, base, arm swing, and turning. Heel and toe walking are normal. Tandem gait is normal.  Romberg is absent.  REVIEW OF SYSTEMS:  Full 14 system review of systems performed and notable only for as above All other review of systems were negative.   ALLERGIES: Allergies  Allergen Reactions   Gadolinium Derivatives Hives    Pt complained at end of study about itching on left side at waistline. Only one visible hive, pt was seen by dr Mosetta Putt. He felt since so  slight, no benadryl given, pt stayed at office for extra 20 minutes to be checked. Pt needs to take 50 mg of benadryl 1 hr prior to mri scan, per dr. Karin Golden.     HOME MEDICATIONS: Current Outpatient Medications  Medication Sig Dispense Refill   albuterol (VENTOLIN HFA) 108 (90 Base) MCG/ACT inhaler Inhale 2 puffs into the lungs every 6 (six) hours as needed for wheezing or shortness of breath. 18  g 5   desonide (DESOWEN) 0.05 % cream Apply twice a day on affected areas for one week, then once a day for one more week and stop it     fexofenadine (ALLEGRA) 60 MG tablet Take 60 mg by mouth 2 (two) times daily.     OPZELURA 1.5 % CREA APPLY 1 APPLICATION TOPICALLY 2 (TWO) TIMES DAILY AS NEEDED (RASH). 60 g 5   No current facility-administered medications for this visit.    PAST MEDICAL HISTORY: Past Medical History:  Diagnosis Date   Asthma    Back pain    GERD (gastroesophageal reflux disease)    Paresthesia    Seasonal allergies     PAST SURGICAL HISTORY: Past Surgical History:  Procedure Laterality Date   BREAST BIOPSY Right 2019   uterine polyp removal       FAMILY HISTORY: Family History  Problem Relation Age of Onset   Osteoarthritis Mother    Colon cancer Father        78   Prostate cancer Father    Breast cancer Paternal Aunt    Breast cancer Paternal Aunt    Diabetes Neg Hx    CAD Neg Hx     SOCIAL HISTORY: Social History   Socioeconomic History   Marital status: Divorced    Spouse name: Not on file   Number of children: 1   Years of education: Not on file   Highest education level: Not on file  Occupational History   Occupation: Magazine features editor: [employer]  Tobacco Use   Smoking status: Never   Smokeless tobacco: Never  Substance and Sexual Activity   Alcohol use: Yes    Alcohol/week: 1.0 standard drink of alcohol    Types: 1 Glasses of wine per week    Comment: rarely    Drug use: Not Currently   Sexual activity: Yes  Other Topics Concern   Not on file  Social History Narrative   From [place]   Mathias separated    Kraemer, daughter    Social Determinants of Health   Financial Resource Strain: Not on file  Food Insecurity: Not on file  Transportation Needs: Not on file  Physical Activity: Not on file  Stress: Not on file  Social Connections: Not on file  Intimate Partner Violence: Not on file      Levert Feinstein,  M.D. Ph.D.  [facility] [address], [address] [place], [place] [address] Ph: [phone] Fax: [phone]  CC:  Wanda Plump, MD [address] [place],  [place] [address]  Wanda Plump, MD

## 2023-04-29 ENCOUNTER — Ambulatory Visit: Payer: BC Managed Care – PPO | Admitting: Neurology

## 2023-04-29 VITALS — BP 119/70 | HR 66 | Ht 69.0 in | Wt 143.0 lb

## 2023-04-29 DIAGNOSIS — R202 Paresthesia of skin: Secondary | ICD-10-CM

## 2023-04-29 DIAGNOSIS — G5601 Carpal tunnel syndrome, right upper limb: Secondary | ICD-10-CM

## 2023-04-29 NOTE — Procedures (Signed)
Full Name: Kaitlyn Gallagher Gender: Female MRN #: [id number] Date of Birth: 1971/06/05    Visit Date: 04/29/2023 08:14 Age: 52 Years Examining Physician: Dr. Levert Feinstein Referring Physician: Dr. Levert Feinstein Height: 5 feet 9 inch History: 52 year old female presenting with intermittent bilateral hands paresthesia   Summary of the test: Nerve conduction study: Bilateral median and ulnar sensory responses were normal.  Right transcarpal comparison showed right median nerve was 0.4 mm slowed compared to ipsilateral right ulnar mixed response.  Left side showed no significant abnormality  Bilateral ulnar, and median motor responses were normal. Left sural sensory responses were normal.  Left peroneal to EDB motor responses were normal.  Electromyography: Selected needle examinations of right upper extremity and cervical paraspinal muscles were normal.   Conclusion: This is a slight abnormal study.  There is electrodiagnostic evidence of right median neuropathy across the wrist consistent with slight right carpal tunnel syndrome.  There is no evidence of right cervical radiculopathy or large fiber peripheral neuropathy.    ------------------------------- Kaitlyn Gallagher.D.Ph.D.  [facility] [address], [address] [place], [place] [address] Tel: [phone] Fax: [phone]  Verbal informed consent was obtained from the patient, patient was informed of potential risk of procedure, including bruising, bleeding, hematoma formation, infection, muscle weakness, muscle pain, numbness, among others.        MNC    Nerve / Sites Muscle Latency Ref. Amplitude Ref. Rel Amp Segments Distance Velocity Ref. Area    ms ms mV mV %  cm m/s m/s mVms  R Median - APB     Wrist APB 3.2 ?4.4 4.8 ?4.0 100 Wrist - APB 7   19.9     Upper arm APB 8.1  5.7  120 Upper arm - Wrist 28 57 ?49 23.7  L Median - APB     Wrist APB 3.2 ?4.4 5.9 ?4.0 100 Wrist - APB 7   26.4     Upper arm  APB 7.6  5.4  92.4 Upper arm - Wrist 25 57 ?49 20.6  R Ulnar - ADM     Wrist ADM 2.4 ?3.3 6.3 ?6.0 100 Wrist - ADM 7   28.3     B.Elbow ADM 4.8  6.7  106 B.Elbow - Wrist 14 60 ?49 30.1     A.Elbow ADM 7.4  6.9  103 A.Elbow - B.Elbow 18 68 ?49 29.4  L Ulnar - ADM     Wrist ADM 2.5 ?3.3 5.6 ?6.0 100 Wrist - ADM 7   25.2     B.Elbow ADM 4.8  5.6  99.6 B.Elbow - Wrist 15 64 ?49 26.1     A.Elbow ADM 7.6  5.4  97.1 A.Elbow - B.Elbow 15 54 ?49 25.1  L Peroneal - EDB     Ankle EDB 5.2 ?6.5 4.9 ?2.0 100 Ankle - EDB 9   20.4     Fib head EDB 11.6  2.8  57.9 Fib head - Ankle 28 44 ?44 12.4     Pop fossa EDB 13.8  4.6  163 Pop fossa - Fib head 12 53 ?44 20.2         Pop fossa - Ankle                   SNC    Nerve / Sites Rec. Site Peak Lat Ref.  Amp Ref. Segments Distance Peak Diff Ref.    ms ms V V  cm ms ms  L Sural - Ankle (Calf)     Calf Ankle 3.3 ?4.4 24 ?6 Calf - Ankle 14    R Median, Ulnar - Transcarpal comparison     Median Palm Wrist 2.2 ?2.2 49 ?35 Median Palm - Wrist 8       Ulnar Palm Wrist 1.8 ?2.2 31 ?12 Ulnar Palm - Wrist 8          Median Palm - Ulnar Palm  0.4 ?0.4  L Median, Ulnar - Transcarpal comparison     Median Palm Wrist 2.2 ?2.2 49 ?35 Median Palm - Wrist 8       Ulnar Palm Wrist 2.1 ?2.2 32 ?12 Ulnar Palm - Wrist 8          Median Palm - Ulnar Palm  0.0 ?0.4  R Median - Orthodromic (Dig II, Mid palm)     Dig II Wrist 3.0 ?3.4 27 ?10 Dig II - Wrist 13    L Median - Orthodromic (Dig II, Mid palm)     Dig II Wrist 3.1 ?3.4 31 ?10 Dig II - Wrist 13    R Ulnar - Orthodromic, (Dig V, Mid palm)     Dig V Wrist 2.8 ?3.1 11 ?5 Dig V - Wrist 11    L Ulnar - Orthodromic, (Dig V, Mid palm)     Dig V Wrist 2.8 ?3.1 11 ?5 Dig V - Wrist 67                     F  Wave    Nerve F Lat Ref.   ms ms  R Ulnar - ADM 25.9 ?32.0  L Ulnar - ADM 27.5 ?32.0         EMG Summary Table    Spontaneous MUAP Recruitment  Muscle IA Fib PSW Fasc Other Amp Dur. Poly Pattern  R. First  dorsal interosseous Normal None None None _______ Normal Normal Normal Normal  R. Pronator teres Normal None None None _______ Normal Normal Normal Normal  R. Biceps brachii Normal None None None _______ Normal Normal Normal Normal  R. Deltoid Normal None None None _______ Normal Normal Normal Normal  R. Triceps brachii Normal None None None _______ Normal Normal Normal Normal  R. Cervical paraspinals Normal None None None _______ Normal Normal Normal Normal

## 2023-05-01 ENCOUNTER — Other Ambulatory Visit: Payer: Self-pay | Admitting: Neurology

## 2023-05-04 DIAGNOSIS — G5601 Carpal tunnel syndrome, right upper limb: Secondary | ICD-10-CM | POA: Insufficient documentation

## 2023-05-27 ENCOUNTER — Other Ambulatory Visit: Payer: Self-pay | Admitting: Obstetrics and Gynecology

## 2023-05-27 DIAGNOSIS — Z1231 Encounter for screening mammogram for malignant neoplasm of breast: Secondary | ICD-10-CM

## 2023-06-29 ENCOUNTER — Ambulatory Visit: Payer: BC Managed Care – PPO

## 2023-07-08 ENCOUNTER — Ambulatory Visit
Admission: RE | Admit: 2023-07-08 | Discharge: 2023-07-08 | Disposition: A | Discharge status: Home / Self Care | Payer: BC Managed Care – PPO | Source: Ambulatory Visit | Attending: Obstetrics and Gynecology | Admitting: Obstetrics and Gynecology

## 2023-07-08 DIAGNOSIS — Z1231 Encounter for screening mammogram for malignant neoplasm of breast: Secondary | ICD-10-CM

## 2023-07-08 LAB — HM PAP SMEAR

## 2023-07-11 ENCOUNTER — Encounter: Payer: BC Managed Care – PPO | Admitting: Internal Medicine

## 2023-07-18 ENCOUNTER — Ambulatory Visit (INDEPENDENT_AMBULATORY_CARE_PROVIDER_SITE_OTHER): Payer: BC Managed Care – PPO | Admitting: Internal Medicine

## 2023-07-18 ENCOUNTER — Encounter: Payer: Self-pay | Admitting: Internal Medicine

## 2023-07-18 VITALS — BP 120/68 | HR 68 | Temp 97.8°F | Resp 16 | Ht 69.0 in | Wt 146.2 lb

## 2023-07-18 DIAGNOSIS — Z0000 Encounter for general adult medical examination without abnormal findings: Secondary | ICD-10-CM | POA: Diagnosis not present

## 2023-07-18 DIAGNOSIS — N951 Menopausal and female climacteric states: Secondary | ICD-10-CM | POA: Diagnosis not present

## 2023-07-18 NOTE — Patient Instructions (Addendum)
Vaccines I recommend: Covid booster Shingrix (shingles) Flu shot this fall       GO TO THE LAB : Get the blood work     GO TO THE FRONT DESK, PLEASE SCHEDULE YOUR APPOINTMENTS Come back for   a physical exam in 1 year

## 2023-07-18 NOTE — Progress Notes (Unsigned)
Subjective:    Patient ID: Kaitlyn Gallagher, female    DOB: [birth year], 52 y.o.   MRN: [id number]  DOS:  07/18/2023 Type of visit - description: CPX Here for CPX. In general feels well.   Review of Systems See above   Past Medical History:  Diagnosis Date   Asthma    Back pain    GERD (gastroesophageal reflux disease)    Paresthesia    Seasonal allergies     Past Surgical History:  Procedure Laterality Date   BREAST BIOPSY Right 2019   uterine polyp removal       Current Outpatient Medications  Medication Instructions   albuterol (VENTOLIN HFA) 108 (90 Base) MCG/ACT inhaler 2 puffs, Inhalation, Every 6 hours PRN   desonide (DESOWEN) 0.05 % cream Apply twice a day on affected areas for one week, then once a day for one more week and stop it   DULoxetine (CYMBALTA) 30 MG capsule Oral, Daily   DULoxetine (CYMBALTA) 60 mg, Oral, Daily   fexofenadine (ALLEGRA) 60 mg, Oral, 2 times daily   OPZELURA 1.5 % CREA 1 Application, Topical, 2 times daily PRN       Objective:   Physical Exam BP 120/68   Pulse 68   Temp 97.8 F (36.6 C) (Oral)   Resp 16   Ht 5\' 9"  (1.753 m)   Wt 146 lb 4 oz (66.3 kg)   LMP 07/04/2023 (Exact Date)   SpO2 96%   BMI 21.60 kg/m  General: Well developed, NAD, BMI noted Neck: No  thyromegaly  HEENT:  Normocephalic . Face symmetric, atraumatic Lungs:  CTA B Normal respiratory effort, no intercostal retractions, no accessory muscle use. Heart: RRR,  no murmur.  Abdomen:  Not distended, soft, non-tender. No rebound or rigidity.   Lower extremities: no pretibial edema bilaterally  Skin: Exposed areas without rash. Not pale. Not jaundice Neurologic:  alert & oriented X3.  Speech normal, gait appropriate for age and unassisted Strength symmetric and appropriate for age.  Psych: Cognition and judgment appear intact.  Cooperative with normal attention span and concentration.  Behavior appropriate. No anxious or depressed appearing.      Assessment     Assessment Allergic rhinitis, atopic dermatitis GERD (h/o GI eval ~ 2005, patient reports she was diagnosed with dysmotility?) History of asthma Not on BCP   PLAN Here for CPX - Td: 06/2018 - Vaccines I recommend: Shingrix optional, COVID booster, flu shot every fall. - Saw gyn recently, had a MMG 06/2023.   -D/t FH breast ca reports she had a genetic testing and was told she is high risk for breast ca,  plan is breast MRI q 2 years, last 2023 -CCS: + FH colon ca, father age at age 41.  (-) Hemoccults 2019.  s/p cscope 3/ 2021, Dr Loreta Ave, no polyps, 5 years per report -Labs: CMP FLP CBC vitamin D FSH LH -Diet and exercise: Doing great.   Paresthesias: Saw neurology, NCV R median neuropathy/CTS no other findings.  Symptoms are mild and not bothersome. Neurology switch sertraline to Cymbalta, patient decided not to take any medication. For now we agreed on observation. Anxiety: See above, decided not to take any medication, she seems to be doing well. Perimenopausal?  Missed 1 or 2 periods in the last year, request labs.  Will do.   RTC 1 year     new issue Paresthesias of the upper extremity, lower extremity, facial bilaterally.  Loss of motor function of left fifth toe.  History of CTS, right hand, he is starting to wear her brace again about 4 weeks ago but that has not helped the paresthesias of the hand this time. Etiology not completely clear. Plan: For now continue using the CTS brace B12, folic acid, TSH, RPR, HIV to rule out possible paresthesias etiology. Referred to neurology.  I wonder if she needs f

## 2023-07-19 ENCOUNTER — Encounter: Payer: Self-pay | Admitting: Internal Medicine

## 2023-07-19 NOTE — Assessment & Plan Note (Signed)
Here for CPX  Paresthesias: Saw neurology, NCV -- R median neuropathy/CTS no other findings.  Symptoms are mild and not bothersome. Neurology switch sertraline to Cymbalta, patient decided not to take any medication. For now we agreed on observation. Anxiety: See above, decided not to take any medication, she seems to be doing well. Perimenopausal?  Missed 1 or 2 periods in the last year, request labs.  Will do.   RTC 1 year

## 2023-07-19 NOTE — Assessment & Plan Note (Signed)
Here for CPX - Td: 06/2018 - Vaccines I recommend: Shingrix optional, COVID booster, flu shot every fall. - Saw gyn recently, had a MMG 06/2023.   -D/t FH breast ca reports she had a genetic testing and was told she is high risk for breast ca,  plan is breast MRI q 2 years, last MRI 2023 -CCS: + FH colon ca, father age at age 50.  (-) Hemoccults 2019.  s/p cscope 3/ 2021, Dr Loreta Ave, no polyps, 5 years per report -Labs: CMP FLP CBC vitamin D FSH LH -Diet and exercise: Doing great

## 2023-07-21 ENCOUNTER — Encounter: Payer: Self-pay | Admitting: Internal Medicine

## 2023-11-24 ENCOUNTER — Encounter: Payer: Self-pay | Admitting: Internal Medicine

## 2023-11-24 NOTE — Telephone Encounter (Signed)
Pt has been scheduled for 11/25/23 3:40 pm with pcp.

## 2023-11-25 ENCOUNTER — Encounter: Payer: Self-pay | Admitting: Internal Medicine

## 2023-11-25 ENCOUNTER — Ambulatory Visit: Payer: BC Managed Care – PPO | Admitting: Internal Medicine

## 2023-11-25 VITALS — BP 120/80 | HR 69 | Temp 97.6°F | Resp 16 | Ht 69.0 in | Wt 147.5 lb

## 2023-11-25 DIAGNOSIS — F419 Anxiety disorder, unspecified: Secondary | ICD-10-CM

## 2023-11-25 MED ORDER — SERTRALINE HCL 50 MG PO TABS
75.0000 mg | ORAL_TABLET | Freq: Every day | ORAL | 2 refills | Status: DC
Start: 2023-11-25 — End: 2024-03-02

## 2023-11-25 NOTE — Patient Instructions (Signed)
After 10 days of  sertraline 50 mg, increase to 75 mg daily.  If you are satisfied with the dose, you can stay on 75 mg daily.  If you think you need more, we can increase the dose, let me know.  Continue counseling  If you develop severe palpitations, chest pain or difficulty breathing: Let me know, if severe symptoms seek medical attention. See cardiology as planned  Next visit in 3 months

## 2023-11-25 NOTE — Progress Notes (Unsigned)
   Subjective:    Patient ID: Kaitlyn Gallagher, female    DOB: 1971/07/12, 52 y.o.   MRN: [id number]  DOS:  11/25/2023 Type of visit - description: to discuss anxiety  He has chronic on and off anxiety. Symptoms increase in the last month.  The reason she is here is because she had 2 episode of palpitations: Felt as her heart was going fast, happening at work, one of the episodes were triggered by stress. Symptoms last less than a minute, she is simply takes a deep breath and "calm down" and the symptoms passed. No associated chest pain, syncope, difficulty breathing.  She runs regularly and when she does she feels great, no chest pain or difficulty breathing.  No palpitations  Review of Systems See above   Past Medical History:  Diagnosis Date   Asthma    Back pain    GERD (gastroesophageal reflux disease)    Paresthesia    Seasonal allergies     Past Surgical History:  Procedure Laterality Date   BREAST BIOPSY Right 2019   uterine polyp removal       Current Outpatient Medications  Medication Instructions   albuterol (VENTOLIN HFA) 108 (90 Base) MCG/ACT inhaler 2 puffs, Inhalation, Every 6 hours PRN   desonide (DESOWEN) 0.05 % cream Apply twice a day on affected areas for one week, then once a day for one more week and stop it   fexofenadine (ALLEGRA) 60 mg, Oral, 2 times daily   OPZELURA 1.5 % CREA 1 Application, Topical, 2 times daily PRN   sertraline (ZOLOFT) 50 mg, Oral, Daily       Objective:   Physical Exam BP 120/80   Pulse 69   Temp 97.6 F (36.4 C) (Oral)   Resp 16   Ht 5\' 9"  (1.753 m)   Wt 147 lb 8 oz (66.9 kg)   SpO2 97%   BMI 21.78 kg/m  General:   Well developed, NAD, BMI noted. HEENT:  Normocephalic . Face symmetric, atraumatic Lungs:  CTA B Normal respiratory effort, no intercostal retractions, no accessory muscle use. Heart: RRR,  no murmur.  Lower extremities: no pretibial edema bilaterally  Skin: Not pale. Not  jaundice Neurologic:  alert & oriented X3.  Speech normal, gait appropriate for age and unassisted Psych--  Cognition and judgment appear intact.  Cooperative with normal attention span and concentration.  Behavior appropriate. No anxious or depressed appearing.      Assessment      Assessment Allergic rhinitis, atopic dermatitis GERD (h/o GI eval ~ 2005, patient reports she was diagnosed with dysmotility?) History of asthma Not on BCP   PLAN Anxiety: Chronic issue, exacerbated, slightly worse for the last 4 weeks, has developed palpitations x 2; no red flags. Most likely palpitations are anxiety related. Was prescribed Cymbalta and sertraline before, never took them consistently, self start sertraline 50 mg daily 3 days ago. Plan: Listening therapy provided today. Continue sertraline, increase to 75 mg daily, okay to increase to 100 mg if needed, see AVS. Continue talking with a therapist. Watch for red flag symptoms, see AVS. Plans to see cardiology next month. RTC 3 months

## 2023-11-26 NOTE — Assessment & Plan Note (Signed)
Anxiety: Chronic issue, exacerbated , slightly worse for the last 4 weeks, has developed palpitations x 2; no red flags. Most likely palpitations are anxiety related. Was prescribed Cymbalta and sertraline before, never took them consistently, self start sertraline 50 mg daily 3 days ago. Plan: Listening therapy provided today. Continue sertraline, increase to 75 mg daily, okay to increase to 100 mg if needed, see AVS. Continue talking with a therapist. Watch for red flag symptoms, see AVS. Plans to see cardiology next month. RTC 3 months

## 2024-01-03 NOTE — Progress Notes (Signed)
 Chief Complaint  Patient presents with   New Patient (Initial Visit)    Palpitations    History of Present Illness: 53 yo female with history of anxiety, asthma and GERD who is here today as a new consult, referred by Dr Neomi Banks, for evaluation of palpitations. She tells me that she has anxiety over the past few years. She has been taking Zoloft  for the past few months. She was at work in November 2024 and she felt her heart racing and she felt dizzy. This lasted for one minute and then resolved. 2 days ago she had slight chest pressure at rest that lasted for a few seconds. She feels rare palpitations for a few seconds. She feels well overall. She is very active. She enjoys exercising. She is a Chartered loss adjuster.   Primary Care Physician: Ezell Hollow, MD   Past Medical History:  Diagnosis Date   Asthma    Back pain    GERD (gastroesophageal reflux disease)    Paresthesia    Seasonal allergies     Past Surgical History:  Procedure Laterality Date   BREAST BIOPSY Right 2019   uterine polyp removal       Current Outpatient Medications  Medication Sig Dispense Refill   albuterol  (VENTOLIN  HFA) 108 (90 Base) MCG/ACT inhaler Inhale 2 puffs into the lungs every 6 (six) hours as needed for wheezing or shortness of breath. 18 g 5   desonide  (DESOWEN ) 0.05 % cream Apply twice a day on affected areas for one week, then once a day for one more week and stop it     fexofenadine (ALLEGRA) 60 MG tablet Take 60 mg by mouth 2 (two) times daily as needed for allergies or rhinitis.     OPZELURA  1.5 % CREA APPLY 1 APPLICATION TOPICALLY 2 (TWO) TIMES DAILY AS NEEDED (RASH). 60 g 5   sertraline  (ZOLOFT ) 50 MG tablet Take 1.5 tablets (75 mg total) by mouth daily. 60 tablet 2   No current facility-administered medications for this visit.    Allergies  Allergen Reactions   Gadolinium Derivatives Hives    Pt complained at end of study about itching on left side at waistline. Only one visible hive, pt was  seen by dr Laurie Poplar. He felt since so slight, no benadryl given, pt stayed at office for extra 20 minutes to be checked. Pt needs to take 50 mg of benadryl 1 hr prior to mri scan, per dr. Magnus Schuller.     Social History   Socioeconomic History   Marital status: Divorced    Spouse name: Not on file   Number of children: 1   Years of education: Not on file   Highest education level: Not on file  Occupational History   Occupation: Runner, broadcasting/film/video    Employer: [employer]  Tobacco Use   Smoking status: Former    Types: Cigarettes   Smokeless tobacco: Never  Substance and Sexual Activity   Alcohol use: Yes    Alcohol/week: 1.0 standard drink of alcohol    Types: 1 Glasses of wine per week    Comment: rarely    Drug use: Not Currently   Sexual activity: Yes  Other Topics Concern   Not on file  Social History Narrative   From [place]   Mathias separated    Lamar, daughter    Social Drivers of Corporate investment banker Strain: Not on [employer] Insecurity: Not on file  Transportation Needs: Not on file  Physical  Activity: Not on file  Stress: Not on file  Social Connections: Unknown (05/04/2022)   [facility]   Social Network    Social Network: Not on file  Intimate Partner Violence: Unknown (03/26/2022)   Received from [facility], [facility]   HITS    Physically Hurt: Not on file    Insult or Talk Down To: Not on file    Threaten Physical Harm: Not on file    Scream or Curse: Not on file    Family History  Problem Relation Age of Onset   Osteoarthritis Mother    Colon cancer Father        26   Prostate cancer Father    Breast cancer Paternal Aunt    Breast cancer Paternal Aunt    Diabetes Neg Hx    CAD Neg Hx     Review of Systems:  As stated in the HPI and otherwise negative.   BP 136/80   Pulse 78   Ht 5\' 9"  (1.753 m)   Wt 67 kg   SpO2 98%   BMI 21.80 kg/m   Physical Examination: General: Well developed, well  nourished, NAD  HEENT: OP clear, mucus membranes moist  SKIN: warm, dry. No rashes. Neuro: No focal deficits  Musculoskeletal: Muscle strength 5/5 all ext  Psychiatric: Mood and affect normal  Neck: No JVD, no carotid bruits, no thyromegaly, no lymphadenopathy.  Lungs:Clear bilaterally, no wheezes, rhonci, crackles Cardiovascular: Regular rate and rhythm. No murmurs, gallops or rubs. Abdomen:Soft. Bowel sounds present. Non-tender.  Extremities: No lower extremity edema. Pulses are 2 + in the bilateral DP/PT.  EKG:  EKG is ordered today. The ekg ordered today demonstrates  EKG Interpretation Date/Time:  Wednesday January 04 2024 10:02:50 EST Ventricular Rate:  73 PR Interval:  148 QRS Duration:  86 QT Interval:  396 QTC Calculation: 436 R Axis:   13  Text Interpretation: Normal sinus rhythm Normal ECG No previous ECGs available Confirmed by Antoinette Batman [phone]) on 01/04/2024 10:05:40 AM    Recent Labs: 02/11/2023: TSH 0.55 07/18/2023: ALT 13; BUN 19; Creatinine, Ser 0.79; Hemoglobin 13.6; Platelets 339.0; Potassium 3.9; Sodium 137    Wt Readings from Last 3 Encounters:  01/04/24 67 kg  11/25/23 66.9 kg  07/18/23 66.3 kg    Assessment and Plan:   1. Palpitations: Will arrange a 3 day Zio monitor. Will arrange an echocardiogram to assess LV function and exclude structural heart disease. TSH recently normal.   Labs/ tests ordered today include:   Orders Placed This Encounter  Procedures   LONG TERM MONITOR (3-14 DAYS)   EKG 12-Lead   ECHOCARDIOGRAM COMPLETE   Disposition:   F/U with me as needed  Signed, Antoinette Batman, MD, [facility] 01/04/2024 11:11 AM    [facility] [address] [place], [place], [place]  [address] Phone: [phone]; Fax: [phone]

## 2024-01-04 ENCOUNTER — Encounter: Payer: Self-pay | Admitting: Cardiovascular Disease

## 2024-01-04 ENCOUNTER — Ambulatory Visit: Payer: 59 | Attending: Cardiovascular Disease | Admitting: Cardiovascular Disease

## 2024-01-04 ENCOUNTER — Ambulatory Visit: Payer: 59 | Attending: Cardiovascular Disease

## 2024-01-04 VITALS — BP 136/80 | HR 78 | Ht 69.0 in | Wt 147.6 lb

## 2024-01-04 DIAGNOSIS — R002 Palpitations: Secondary | ICD-10-CM

## 2024-01-04 NOTE — Patient Instructions (Signed)
 Medication Instructions:  No changes *If you need a refill on your cardiac medications before your next appointment, please call your pharmacy*   Lab Work: none   Testing/Procedures: Your physician has requested that you have an echocardiogram. Echocardiography is a painless test that uses sound waves to create images of your heart. It provides your doctor with information about the size and shape of your heart and how well your heart's chambers and valves are working. This procedure takes approximately one hour. There are no restrictions for this procedure. Please do NOT wear cologne, perfume, aftershave, or lotions (deodorant is allowed). Please arrive 15 minutes prior to your appointment time.  Please note: We ask at that you not bring children with you during ultrasound (echo/ vascular) testing. Due to room size and safety concerns, children are not allowed in the ultrasound rooms during exams. Our front office staff cannot provide observation of children in our lobby area while testing is being conducted. An adult accompanying a patient to their appointment will only be allowed in the ultrasound room at the discretion of the ultrasound technician under special circumstances. We apologize for any inconvenience.  Zio Heart monitor - see instructions below    Follow-Up: As needed Other Instructions ZIO XT- Long Term Monitor Instructions  Your physician has requested you wear a ZIO patch monitor for 3 days.  This is a single patch monitor. [employer] supplies one patch monitor per enrollment. Additional stickers are not available. Please do not apply patch if you will be having a Nuclear Stress Test,  Echocardiogram, Cardiac CT, MRI, or Chest Xray during the period you would be wearing the  monitor. The patch cannot be worn during these tests. You cannot remove and re-apply the  ZIO XT patch monitor.  Your ZIO patch monitor will be mailed 3 day USPS to your address on file. It may take  3-5 days  to receive your monitor after you have been enrolled.  Once you have received your monitor, please review the enclosed instructions. Your monitor  has already been registered assigning a specific monitor serial # to you.  Billing and Patient Assistance Program Information  We have supplied [employer] with any of your insurance information on file for billing purposes. [employer] offers a sliding scale Patient Assistance Program for patients that do not have  insurance, or whose insurance does not completely cover the cost of the ZIO monitor.  You must apply for the Patient Assistance Program to qualify for this discounted rate.  To apply, please call [employer] at [phone], select option 4, select option 2, ask to apply for  Patient Assistance Program. Sanna Crystal will ask your household income, and how many people  are in your household. They will quote your out-of-pocket cost based on that information.  [employer] will also be able to set up a 40-month, interest-free payment plan if needed.  Applying the monitor   Shave hair from upper left chest.  Hold abrader disc by orange tab. Rub abrader in 40 strokes over the upper left chest as  indicated in your monitor instructions.  Clean area with 4 enclosed alcohol pads. Let dry.  Apply patch as indicated in monitor instructions. Patch will be placed under collarbone on left  side of chest with arrow pointing upward.  Rub patch adhesive wings for 2 minutes. Remove white label marked "1". Remove the white  label marked "2". Rub patch adhesive wings for 2 additional minutes.  While looking in a mirror, press and release button in  center of patch. A small green light will  flash 3-4 times. This will be your only indicator that the monitor has been turned on.  Do not shower for the first 24 hours. You may shower after the first 24 hours.  Press the button if you feel a symptom. You will hear a small click. Record Date, Time and  Symptom in the  Patient Logbook.  When you are ready to remove the patch, follow instructions on the last 2 pages of Patient  Logbook. Stick patch monitor onto the last page of Patient Logbook.  Place Patient Logbook in the blue and white box. Use locking tab on box and tape box closed  securely. The blue and white box has prepaid postage on it. Please place it in the mailbox as  soon as possible. Your physician should have your test results approximately 7 days after the  monitor has been mailed back to [facility].  Call [facility] Customer Care at [phone] if you have questions regarding  your ZIO XT patch monitor. Call them immediately if you see an orange light blinking on your  monitor.  If your monitor falls off in less than 4 days, contact our Monitor department at [phone].  If your monitor becomes loose or falls off after 4 days call [employer] at [phone] for  suggestions on securing your monitor

## 2024-01-04 NOTE — Progress Notes (Unsigned)
 Enrolled for [employer] to mail a ZIO XT long term holter monitor to the patients address on file.

## 2024-01-12 DIAGNOSIS — R002 Palpitations: Secondary | ICD-10-CM | POA: Diagnosis not present

## 2024-01-27 ENCOUNTER — Ambulatory Visit (HOSPITAL_COMMUNITY): Payer: 59 | Attending: Cardiovascular Disease

## 2024-01-27 DIAGNOSIS — R002 Palpitations: Secondary | ICD-10-CM

## 2024-01-27 DIAGNOSIS — I361 Nonrheumatic tricuspid (valve) insufficiency: Secondary | ICD-10-CM | POA: Diagnosis not present

## 2024-01-27 LAB — ECHOCARDIOGRAM COMPLETE
Area-P 1/2: 3.71 cm2
S' Lateral: 2.2 cm

## 2024-02-24 ENCOUNTER — Ambulatory Visit: Payer: BC Managed Care – PPO | Admitting: Internal Medicine

## 2024-03-02 ENCOUNTER — Encounter: Payer: Self-pay | Admitting: Internal Medicine

## 2024-03-02 ENCOUNTER — Ambulatory Visit (INDEPENDENT_AMBULATORY_CARE_PROVIDER_SITE_OTHER): Payer: BC Managed Care – PPO | Admitting: Internal Medicine

## 2024-03-02 VITALS — BP 116/82 | HR 71 | Temp 98.0°F | Resp 12 | Ht 69.0 in | Wt 147.2 lb

## 2024-03-02 DIAGNOSIS — R002 Palpitations: Secondary | ICD-10-CM

## 2024-03-02 DIAGNOSIS — L2089 Other atopic dermatitis: Secondary | ICD-10-CM

## 2024-03-02 DIAGNOSIS — F419 Anxiety disorder, unspecified: Secondary | ICD-10-CM

## 2024-03-02 DIAGNOSIS — M25842 Other specified joint disorders, left hand: Secondary | ICD-10-CM

## 2024-03-02 MED ORDER — SERTRALINE HCL 50 MG PO TABS: 75.0000 mg | ORAL_TABLET | Freq: Every day | ORAL | 6 refills | Status: DC

## 2024-03-02 NOTE — Progress Notes (Signed)
   Subjective:    Patient ID: Kaitlyn Gallagher, female    DOB: [birth year], 53 y.o.   MRN: [id number]  DOS:  03/02/2024 Type of visit - description: f/u  Today we talk about anxiety, palpitations, allergies and a knot at the left hand. Overall anxiety is better, no further palpitations, saw cardiology.  Notes reviewed. Allergies are mostly a rash around the eyes.  Review of Systems See above   Past Medical History:  Diagnosis Date   Asthma    Back pain    GERD (gastroesophageal reflux disease)    Paresthesia    Seasonal allergies     Past Surgical History:  Procedure Laterality Date   BREAST BIOPSY Right 2019   uterine polyp removal       Current Outpatient Medications  Medication Instructions   albuterol (VENTOLIN HFA) 108 (90 Base) MCG/ACT inhaler 2 puffs, Inhalation, Every 6 hours PRN   desonide (DESOWEN) 0.05 % cream Apply twice a day on affected areas for one week, then once a day for one more week and stop it   fexofenadine (ALLEGRA) 60 mg, 2 times daily PRN   OPZELURA 1.5 % CREA 1 Application, Topical, 2 times daily PRN   sertraline (ZOLOFT) 75 mg, Oral, Daily   tacrolimus (PROTOPIC) 0.03 % ointment 2 times daily       Objective:   Physical Exam Ht 5\' 9"  (1.753 m)   LMP 02/17/2024   BMI 21.80 kg/m  General:   Well developed, NAD, BMI noted. HEENT:  Normocephalic . Face symmetric, atraumatic Left hand: At the base of the index, has a 4 mm subdermal mobile lump Skin: Not pale. Not jaundice Neurologic:  alert & oriented X3.  Speech normal, gait appropriate for age and unassisted Psych--  Cognition and judgment appear intact.  Cooperative with normal attention span and concentration.  Behavior appropriate. No anxious or depressed appearing.      Assessment      Assessment Allergic rhinitis, atopic dermatitis H/o asthma  GERD (h/o GI eval ~ 2005, patient reports she was diagnosed with dysmotility?) Not on BCP   PLAN Anxiety: Since last  visit, he is on sertraline 75 mg qd, feeling well.  No change, RF sent. Palpitations: Saw cardiology 01/04/2024 for palpitations, echo normal, monitor showed PACs, PVCs.   Allergies: Having a itchy rash around the eyes, saw her dermatologist, prescribed topical tacrolimus, was recommended to see a allergist.  Referral sent. Left hand lump: Going on for years, saw hand surgery before, would like to see them again, the lump has grown and is causing some discomfort. Preventive care: Declines any vaccines today. RTC 06-2024 CPX.

## 2024-03-02 NOTE — Patient Instructions (Addendum)
   Please go to the front desk: Arrange a physical exam for 06/2024

## 2024-03-03 DIAGNOSIS — F419 Anxiety disorder, unspecified: Secondary | ICD-10-CM | POA: Insufficient documentation

## 2024-03-03 NOTE — Assessment & Plan Note (Signed)
 Anxiety: Since last visit, he is on sertraline 75 mg qd, feeling well.  No change, RF sent. Palpitations: Saw cardiology 01/04/2024 for palpitations, echo normal, monitor showed PACs, PVCs.   Allergies: Having a itchy rash around the eyes, saw her dermatologist, prescribed topical tacrolimus, was recommended to see a allergist.  Referral sent. Left hand lump: Going on for years, saw hand surgery before, would like to see them again, the lump has grown and is causing some discomfort. Preventive care: Declines any vaccines today. RTC 06-2024 CPX.

## 2024-03-05 NOTE — Addendum Note (Signed)
 Addended byConrad Harts D on: 03/05/2024 07:45 AM   Modules accepted: Orders

## 2024-04-03 NOTE — Patient Instructions (Incomplete)
 Dermatitis of eyelids bilaterally Allergic rhinitis Oral allergy syndrome -environmental allergy testing on 08/11/22 was positive to weed pollen, tree pollen, dust mite, horse.  Allergen avoidance measures provided.  -food allergy testing on 08/11/22 was positive to celery. She noticed no difference since avoiding celery  -recommend you continue to avoid products positive on previous patch testing -Schedule for NAC 80 patch testing in May.  You will need to not have been on any steroids four weeks prior to this appointment.  The patch will be placed on a Monday and will be read on a Wednesday and Friday. - OTC antihistamine as needed -can continue Flonase 2 sprays each nostril daily for 1-2 weeks at a time before stopping once nasal congestion improves for maximum benefit. In the right nostril, point the applicator out toward the right ear. In the left nostril, point the applicator out toward the left ear -can use Desonide twice a day as needed for flared areas (red, irritated, dry, itchy, patchy, scaly, flaky) only.  This is a steroid.  -Continue tacrolimus as prescribed by dermatology twice a day needed for flared areas (red, irritated, dry, itchy, patchy, scaly, flaky) only.  This is a non-steroid ointment. -continue moisturization daily at least with your vanicream -the oral allergy syndrome (OAS) or pollen-food allergy syndrome (PFAS) is a relatively common form of food allergy, particularly in adults. It typically occurs in people who have pollen allergies when the immune system "sees" proteins on the food that look like proteins on the pollen. This results in the allergy antibody (IgE) binding to the food instead of the pollen. Patients typically report itching and/or mild swelling of the mouth and throat immediately following ingestion of certain uncooked fruits (including nuts) or raw vegetables. Only a very small number of affected individuals experience systemic allergic reactions, such as  anaphylaxis which occurs with true food allergies.    - we have discussed the following in regards to foods:   Allergy: food allergy is when you have eaten a food, developed an allergic reaction after eating the food and have IgE to the food (positive food testing either by skin testing or blood testing).  Food allergy could lead to life threatening symptoms  Sensitivity: occurs when you have IgE to a food (positive food testing either by skin testing or blood testing) but is a food you eat without any issues.  This is not an allergy and we recommend keeping the food in the diet  Intolerance: this is when you have negative testing by either skin testing or blood testing thus not allergic but the food causes symptoms (like belly pain, bloating, diarrhea etc) with ingestion.  These foods should be avoided to prevent symptoms.    Asthma-not well controlled Your breathing test looked good today,but after 4 puffs of Xopenex your lug function improved by 16% and 380 mL Start Flovent (fluticasone) 110 mcg 2 puffs twice a day with spacer to help prevent cough and wheeze. Rinse mouth out after. Spacer given along with demonstration May use albuterol 2 puffs every 4-6 hours as needed for cough, wheeze, tightness in chest, or shortness of breath  Asthma control goals:  Full participation in all desired activities (may need albuterol before activity) Albuterol use two time or less a week on average (not counting use with activity) Cough interfering with sleep two time or less a month Oral steroids no more than once a year No hospitalizations  Follow-up in 6 weeks for NAC 80 patch test placement and follow  up on asthma or sooner if needed

## 2024-04-04 ENCOUNTER — Other Ambulatory Visit: Payer: Self-pay

## 2024-04-04 ENCOUNTER — Encounter: Payer: Self-pay | Admitting: Family

## 2024-04-04 ENCOUNTER — Ambulatory Visit: Payer: Self-pay | Admitting: Family

## 2024-04-04 VITALS — BP 124/78 | HR 76 | Resp 18 | Wt 146.8 lb

## 2024-04-04 DIAGNOSIS — H01135 Eczematous dermatitis of left lower eyelid: Secondary | ICD-10-CM

## 2024-04-04 DIAGNOSIS — H01134 Eczematous dermatitis of left upper eyelid: Secondary | ICD-10-CM

## 2024-04-04 DIAGNOSIS — H01131 Eczematous dermatitis of right upper eyelid: Secondary | ICD-10-CM | POA: Diagnosis not present

## 2024-04-04 DIAGNOSIS — J3089 Other allergic rhinitis: Secondary | ICD-10-CM

## 2024-04-04 DIAGNOSIS — T781XXD Other adverse food reactions, not elsewhere classified, subsequent encounter: Secondary | ICD-10-CM

## 2024-04-04 DIAGNOSIS — L309 Dermatitis, unspecified: Secondary | ICD-10-CM | POA: Diagnosis not present

## 2024-04-04 DIAGNOSIS — H01132 Eczematous dermatitis of right lower eyelid: Secondary | ICD-10-CM

## 2024-04-04 DIAGNOSIS — J4531 Mild persistent asthma with (acute) exacerbation: Secondary | ICD-10-CM

## 2024-04-04 MED ORDER — FLUTICASONE PROPIONATE HFA 110 MCG/ACT IN AERO: INHALATION_SPRAY | RESPIRATORY_TRACT | 5 refills | Status: DC

## 2024-04-04 NOTE — Progress Notes (Signed)
 [address] [place] [address] Dept: [phone]  FOLLOW UP NOTE  Patient ID: Kaitlyn Gallagher, female    DOB: 06/10/1971  Age: 53 y.o. MRN: [id number] Date of Office Visit: 04/04/2024  Assessment  Chief Complaint: Allergic Rhinitis  (2 years ago - rash around her eye diagnosed with contact dermitis ) and Asthma (Shortness of breath and wheezing - uses albuterol 1-2 times per week )  HPI Kaitlyn Gallagher is a 53 year old female who presents today for follow-up of dermatitis of eyelids bilaterally, dermatitis of axilla, allergic rhinitis, and oral allergy syndrome.  She was last seen on August 11, 2022 by Dr. Delorse Lek.  She denies any new diagnosis or surgery since her last office visit.  Dermatitis of eyelids bilaterally: She reports that her eyes today look better than other days.  She is still having the areas around her eyes that happens year-round and is mostly on rather than off.  She did recently see a dermatologist and was started on tacrolimus and she reports it does help.  If she does use the desonide that she was prescribed previously, it does help it go away.  She did not feel like Opzelura helped that much.  Right now she is using desonide only as needed when she needs to go out somewhere, tacrolimus twice a day, Vanicream, and Vaseline sometimes.  She reports the skin around her eyes will be itchy and puffy.  She mentions that the dermatologist thinks it is contact dermatitis.  She had previously had patch testing at [facility] approximately 4 to 5 years ago that she reports was positive to nickel and an ingredient found in make-up sponges.  At that time she changed her make-up and lotions.  She reports that she has not wearing any make-up today.  She wonders if it is a food allergy that is causing this.  Discussed how in 2023 she was skin tested to all the foods and they were all negative except celery.  She reports that since avoiding celery it has not made a difference  in her skin.  She also mentions that sometimes she will get a rash on her upper lip.   She has pictures on her phone of her eyes.  She reports sometimes there will be one on her both eyes.  She wonders if gluten could be causing the rash around her eyes.The area in her axilla region is gone now.  She feels like changing deodorants has helped this.   Allergic rhinitis: She reports clear rhinorrhea, nasal congestion, and a little bit of postnasal drip.  She also reports that her ears will feel blocked at times she takes Allegra and uses Flonase nasal spray as needed.  After reviewing proper technique for steroid nasal sprays it was found that she was not using proper technique.  She has not been treated for any sinus infections in the last year.  She wonders if her allergies could be causing her year-round symptoms with her eyes.  She mentions she has a cat.  Her skin testing to environmental allergens on August 11, 2022 was positive to weed pollen, tree pollen, dust mite, and horse.  Her skin testing was negative to cat.  She reports that this spring her asthma has been worse.  She mentions that asthma runs in her family.  She had asthma when she was younger and then disappeared.  But this spring she has had coughing, wheezing, little bit of tightness in her chest, and shortness of  breath.  She denies nocturnal awakenings due to breathing problems, fever, and chills.  She has been using her albuterol maybe 1-2 times a week and reports that it helps.  She reports lately she has been using her albuterol more.  Previously she was barely using it.  Now more conscientious about making sure she has her albuterol with her.  She just got a new albuterol inhaler and does not need a refill.  Since her last office visit she has not made any trips to the emergency room or urgent care due to breathing problems and has not required any systemic steroids due to her asthma.     Drug Allergies:  Allergies  Allergen  Reactions   Gadolinium Derivatives Hives    Pt complained at end of study about itching on left side at waistline. Only one visible hive, pt was seen by dr Laurie Poplar. He felt since so slight, no benadryl given, pt stayed at office for extra 20 minutes to be checked. Pt needs to take 50 mg of benadryl 1 hr prior to mri scan, per dr. Magnus Schuller.     Review of Systems: Negative    Physical Exam: BP 124/78 (BP Location: Right Arm, Patient Position: Sitting, Cuff Size: Normal)   Pulse 76   Resp 18   Wt 146 lb 12.8 oz (66.6 kg)   SpO2 97%   BMI 21.68 kg/m    Physical Exam Constitutional:      Appearance: Normal appearance.  HENT:     Head: Normocephalic and atraumatic.     Comments: Pharynx normal, eyes normal, ears normal, nose: Bilateral lower turbinates mildly edematous with no drainage noted    Right Ear: Tympanic membrane, ear canal and external ear normal.     Left Ear: Tympanic membrane, ear canal and external ear normal.     Mouth/Throat:     Mouth: Mucous membranes are moist.     Pharynx: Oropharynx is clear.  Eyes:     Conjunctiva/sclera: Conjunctivae normal.  Cardiovascular:     Rate and Rhythm: Regular rhythm.     Heart sounds: Normal heart sounds.  Pulmonary:     Effort: Pulmonary effort is normal.     Breath sounds: Normal breath sounds.     Comments: Lungs clear to auscultation Musculoskeletal:     Cervical back: Neck supple.  Skin:    General: Skin is warm.     Comments: Slight swelling noted under both eyes with no erythema  Neurological:     Mental Status: She is alert and oriented to person, place, and time.  Psychiatric:        Mood and Affect: Mood normal.        Behavior: Behavior normal.        Thought Content: Thought content normal.        Judgment: Judgment normal.     Diagnostics:  FVC 3.38 L (90%), FEV1 2.34 L (78%), FEV1/FVC 0.69.  Spirometry indicates normal spirometry.  4 puffs of Xopenex given.  Postbronchodilator response shows FVC 3.72 L  (99%), FEV1 2.72 L (90%) there is a 16% change in FEV1 and 380 mL change in FEV1.  Spirometry indicates normal spirometry.  Assessment and Plan: 1. Eczematous dermatitis of upper and lower eyelids of both eyes   2. Mild persistent asthma with acute exacerbation   3. Dermatitis   4. Non-seasonal allergic rhinitis due to other allergic trigger   5. Pollen-food allergy, subsequent encounter     No orders of the defined  types were placed in this encounter.   Patient Instructions  Dermatitis of eyelids bilaterally Allergic rhinitis Oral allergy syndrome -environmental allergy testing on 08/11/22 was positive to weed pollen, tree pollen, dust mite, horse.  Allergen avoidance measures provided.  -food allergy testing on 08/11/22 was positive to celery. She noticed no difference since avoiding celery  -recommend you continue to avoid products positive on previous patch testing -Schedule for NAC 80 patch testing in May.  You will need to not have been on any steroids four weeks prior to this appointment.  The patch will be placed on a Monday and will be read on a Wednesday and Friday. - OTC antihistamine as needed -can continue Flonase 2 sprays each nostril daily for 1-2 weeks at a time before stopping once nasal congestion improves for maximum benefit. In the right nostril, point the applicator out toward the right ear. In the left nostril, point the applicator out toward the left ear -can use Desonide twice a day as needed for flared areas (red, irritated, dry, itchy, patchy, scaly, flaky) only.  This is a steroid.  -Continue tacrolimus as prescribed by dermatology twice a day needed for flared areas (red, irritated, dry, itchy, patchy, scaly, flaky) only.  This is a non-steroid ointment. -continue moisturization daily at least with your vanicream -the oral allergy syndrome (OAS) or pollen-food allergy syndrome (PFAS) is a relatively common form of food allergy, particularly in adults. It  typically occurs in people who have pollen allergies when the immune system "sees" proteins on the food that look like proteins on the pollen. This results in the allergy antibody (IgE) binding to the food instead of the pollen. Patients typically report itching and/or mild swelling of the mouth and throat immediately following ingestion of certain uncooked fruits (including nuts) or raw vegetables. Only a very small number of affected individuals experience systemic allergic reactions, such as anaphylaxis which occurs with true food allergies.    - we have discussed the following in regards to foods:   Allergy: food allergy is when you have eaten a food, developed an allergic reaction after eating the food and have IgE to the food (positive food testing either by skin testing or blood testing).  Food allergy could lead to life threatening symptoms  Sensitivity: occurs when you have IgE to a food (positive food testing either by skin testing or blood testing) but is a food you eat without any issues.  This is not an allergy and we recommend keeping the food in the diet  Intolerance: this is when you have negative testing by either skin testing or blood testing thus not allergic but the food causes symptoms (like belly pain, bloating, diarrhea etc) with ingestion.  These foods should be avoided to prevent symptoms.    Asthma-not well controlled Your breathing test looked good today,but after 4 puffs of Xopenex your lug function improved by 16% and 380 mL Start Flovent (fluticasone) 110 mcg 2 puffs twice a day with spacer to help prevent cough and wheeze. Rinse mouth out after. Spacer given along with demonstration May use albuterol 2 puffs every 4-6 hours as needed for cough, wheeze, tightness in chest, or shortness of breath  Asthma control goals:  Full participation in all desired activities (may need albuterol before activity) Albuterol use two time or less a week on average (not counting use with  activity) Cough interfering with sleep two time or less a month Oral steroids no more than once a year No hospitalizations  Follow-up in 6 weeks for NAC 80 patch test placement and follow up on asthma or sooner if needed  Return in about 6 weeks (around 05/16/2024), or if symptoms worsen or fail to improve, for NAC 80 patch placement and asthma follow up.    Thank you for the opportunity to care for this patient.  Please do not hesitate to contact me with questions.  Tinnie Forehand, FNP [facility] 

## 2024-05-09 ENCOUNTER — Telehealth: Payer: Self-pay | Admitting: Family

## 2024-05-09 NOTE — Telephone Encounter (Signed)
 Margretta Shi: Please send messages to the office the patient is seen clinical pool.   [place] [facility] please call Kaaren and help answer her questions

## 2024-05-09 NOTE — Telephone Encounter (Signed)
 Dossie called and stated she would like to speak with someone to discuss what she can take to relive her allergy  symptoms until her scheduled visit on 6/9. She states she also has several questions about the Patch testing she is scheduled for. Best contact [phone]

## 2024-05-11 NOTE — Telephone Encounter (Signed)
 Agree  Thank you

## 2024-05-11 NOTE — Telephone Encounter (Signed)
 Is she using Tacrolimus and/or Desonide  creams for her face?  If she is only taking Allegra once a day she can try taking it twice to see if it helps with itching. Caution as this may make her sleepy.

## 2024-05-11 NOTE — Telephone Encounter (Signed)
 I called the patient and she said she has been experiencing a flare up on her face- it is swollen,red,itchy, dry skin like, some darkness. I informed to take pictures of the flare up. She is requesting something different to take besides allegra to help with her symptoms. I informed per her AVS she can take otc antihistamines as needed. The patient said she has tried zyrtec, xyzal  and claritin and they do not work for her. She is also requesting food allergy  testing/blood work at her next appointment. I informed it will be a separate visit if she gets the patches placed. She also informed me that the skin issues that have been going on also run in her family. Please advise on what else the patient can take.

## 2024-05-11 NOTE — Telephone Encounter (Signed)
 I called the patient and she has not applied either of the creams. I informed per the last AVS she can apply as needed to flared areas. The patient will try to take the allegra and she mentioned she will also try benadryl. I informed benadryl is not to be taken daily only for breakthrough symptoms and informed it could make her sleepy.

## 2024-05-23 ENCOUNTER — Encounter: Payer: Self-pay | Admitting: Family

## 2024-05-23 ENCOUNTER — Ambulatory Visit: Admitting: Family

## 2024-05-23 VITALS — BP 100/60 | HR 72 | Temp 98.6°F | Resp 20

## 2024-05-23 DIAGNOSIS — H01135 Eczematous dermatitis of left lower eyelid: Secondary | ICD-10-CM | POA: Diagnosis not present

## 2024-05-23 DIAGNOSIS — H01134 Eczematous dermatitis of left upper eyelid: Secondary | ICD-10-CM

## 2024-05-23 DIAGNOSIS — H01132 Eczematous dermatitis of right lower eyelid: Secondary | ICD-10-CM

## 2024-05-23 DIAGNOSIS — H01131 Eczematous dermatitis of right upper eyelid: Secondary | ICD-10-CM | POA: Diagnosis not present

## 2024-05-23 DIAGNOSIS — L309 Dermatitis, unspecified: Secondary | ICD-10-CM | POA: Diagnosis not present

## 2024-05-23 DIAGNOSIS — J4531 Mild persistent asthma with (acute) exacerbation: Secondary | ICD-10-CM

## 2024-05-23 MED ORDER — DESONIDE 0.05 % EX CREA: TOPICAL_CREAM | CUTANEOUS | 3 refills | Status: AC

## 2024-05-23 NOTE — Progress Notes (Signed)
 [address] [place] [address] Dept: [phone]  FOLLOW UP NOTE  Patient ID: Kaitlyn Gallagher, female    DOB: [birth year]  Age: 53 y.o. MRN: [id number] Date of Office Visit: 05/23/2024  Assessment  Chief Complaint: Angioedema (Around ear and eyes)  HPI Kaitlyn Gallagher is a 53 year old female who presents today for acute visit of swelling itchy eyes and around right ear.  She was last seen on April 04, 2024 by myself for dermatitis of eyelids bilaterally, allergic rhinitis, and oral allergy  syndrome.  She denies any new diagnosis or surgery since her last office visit.  Dermatitis of eyelids bilaterally: She reports that she continues to have swelling, itchy, red, flaky skin around her eyes.  This has been going on for at least 2 years and it never completely goes away.  She mentions at first it will get inflamed and then it will become dry and itchy.  She will use desonide  and it works perfect, but as soon as she stops it will come back.  She mentions that she has seen dermatology and she was told that it was something that she is using that is causing her to have this area around her eyes.  She mentions approximately 3 to 4 years ago she had a patch testing that was positive to nickel.  She does feel like it is a food that is causing this and has concerns for food allergies.  Discussed that her skin testing to select foods in August 2023 was negative to everything except celery.  She did not notice any difference in her skin after avoiding celery.  When she has this rash she denies any concomitant cardiorespiratory or gastrointestinal symptoms.  Discussed at this time I did not feel like she needed an epinephrine autoinjector device for the symptoms of dermatitis of her eyelids.  She mentions that her sister has the same symptoms also.  She has tried Claritin, Benadryl, Zyrtec, and Allegra twice a day and is not really help.  She has tried certain diets and it did not make a  difference.  She also notices today she has some redness and it is warm to touch on her right ear.  She denies fever or chills.  She uses Vanicream for moisturization and does not really use anything else make-up wise on her face.  She has been prescribed tacrolimus, but did not feel like it really help.  She has also previously tried Opzelura .  She is scheduled for patch testing with our office this coming Monday.  Asthma: She does mention that her asthma is better since starting Flovent  110 mcg 2 puffs twice a day with spacer.   Drug Allergies:  Allergies  Allergen Reactions   Gadolinium Derivatives Hives    Pt complained at end of study about itching on left side at waistline. Only one visible hive, pt was seen by dr Laurie Poplar. He felt since so slight, no benadryl given, pt stayed at office for extra 20 minutes to be checked. Pt needs to take 50 mg of benadryl 1 hr prior to mri scan, per dr. Magnus Schuller.     Review of Systems: Negative except as per HPI   Physical Exam: BP 100/60   Pulse 72   Temp 98.6 F (37 C)   Resp 20   SpO2 97%    Physical Exam HENT:     Head: Normocephalic.     Comments: Pharynx normal, eyes normal, ears normal, nose normal    Right Ear:  Tympanic membrane, ear canal and external ear normal.     Left Ear: Tympanic membrane, ear canal and external ear normal.     Nose: Nose normal.     Mouth/Throat:     Mouth: Mucous membranes are moist.     Pharynx: Oropharynx is clear.  Eyes:     Conjunctiva/sclera: Conjunctivae normal.  Cardiovascular:     Rate and Rhythm: Regular rhythm.     Heart sounds: Normal heart sounds.  Pulmonary:     Effort: Pulmonary effort is normal.     Breath sounds: Normal breath sounds.     Comments: Lungs clear to auscultation Musculoskeletal:     Cervical back: Neck supple.  Skin:    General: Skin is warm.     Comments: Dry erythematous areas noted around bilateral eyes. Erythema noted on right ear (helix region)  Neurological:      Mental Status: She is alert and oriented to person, place, and time.  Psychiatric:        Mood and Affect: Mood normal.        Behavior: Behavior normal.        Thought Content: Thought content normal.        Judgment: Judgment normal.     Diagnostics:    Assessment and Plan: 1. Dermatitis   2. Eczematous dermatitis of upper and lower eyelids of both eyes   3. Mild persistent asthma with acute exacerbation     Meds ordered this encounter  Medications   desonide  (DESOWEN ) 0.05 % cream    Sig: Use 1 application twice a day as needed to red itchy areas. This is safe to use on the face. Do not use longer than 7 to 10 days in a row.    Dispense:  30 g    Refill:  3    Patient Instructions  Dermatitis of eyelids bilaterally- not well controlled Allergic rhinitis Oral allergy  syndrome -environmental allergy  testing on 08/11/22 was positive to weed pollen, tree pollen, dust mite, horse.  Allergen avoidance measures provided.  -food allergy  testing on 08/11/22 was positive to celery. She noticed no difference since avoiding celery.  -recommend you continue to avoid products positive on previous patch testing -Keep upcoming appointment for NAC 80 patch testing this coming Monday.  No steroids four weeks prior to this appointment.  The patch will be placed on a Monday and will be read on a Wednesday and Friday. She reports that previous patch testing 3-4 years ago was positive to Nickel and she feels like it is a food that causes her dermatitis of eye lids. Information sheet given on nickel allergy  and how certain foods that have high concentration of nickel in them can cause dermatitis. Discussed that I did not want her to avoid eating all these foods. She verbalizes understanding. - OTC antihistamine as needed -can continue Flonase  2 sprays each nostril daily for 1-2 weeks at a time before stopping once nasal congestion improves for maximum benefit. In the right nostril, point the  applicator out toward the right ear. In the left nostril, point the applicator out toward the left ear -Let us  know the name of the over counter eye drop you are using for itchy watery eyes -can use Desonide  twice a day as needed for flared areas (red, irritated, dry, itchy, patchy, scaly, flaky) only.  This is a steroid.  -Continue tacrolimus as prescribed by dermatology twice a day needed for flared areas (red, irritated, dry, itchy, patchy, scaly, flaky) only.  This is a non-steroid ointment. -continue moisturization daily at least with your vanicream -the oral allergy  syndrome (OAS) or pollen-food allergy  syndrome (PFAS) is a relatively common form of food allergy , particularly in adults. It typically occurs in people who have pollen allergies when the immune system "sees" proteins on the food that look like proteins on the pollen. This results in the allergy  antibody (IgE) binding to the food instead of the pollen. Patients typically report itching and/or mild swelling of the mouth and throat immediately following ingestion of certain uncooked fruits (including nuts) or raw vegetables. Only a very small number of affected individuals experience systemic allergic reactions, such as anaphylaxis which occurs with true food allergies.    - we have discussed the following in regards to foods:   Allergy : food allergy  is when you have eaten a food, developed an allergic reaction after eating the food and have IgE to the food (positive food testing either by skin testing or blood testing).  Food allergy  could lead to life threatening symptoms  Sensitivity: occurs when you have IgE to a food (positive food testing either by skin testing or blood testing) but is a food you eat without any issues.  This is not an allergy  and we recommend keeping the food in the diet  Intolerance: this is when you have negative testing by either skin testing or blood testing thus not allergic but the food causes symptoms (like  belly pain, bloating, diarrhea etc) with ingestion.  These foods should be avoided to prevent symptoms.    Asthma-better control Continue Flovent  (fluticasone ) 110 mcg 2 puffs twice a day with spacer to help prevent cough and wheeze. Rinse mouth out after. Spacer given along with demonstration May use albuterol  2 puffs every 4-6 hours as needed for cough, wheeze, tightness in chest, or shortness of breath  Asthma control goals:  Full participation in all desired activities (may need albuterol  before activity) Albuterol  use two time or less a week on average (not counting use with activity) Cough interfering with sleep two time or less a month Oral steroids no more than once a year No hospitalizations  Follow-up on 05/28/24 2 9:30 AM for NAC 80 patch test placement  Return in about 5 days (around 05/28/2024) for patch testing.    Thank you for the opportunity to care for this patient.  Please do not hesitate to contact me with questions.  Tinnie Forehand, FNP [facility] 

## 2024-05-23 NOTE — Patient Instructions (Addendum)
 Dermatitis of eyelids bilaterally- not well controlled Allergic rhinitis Oral allergy  syndrome -environmental allergy  testing on 08/11/22 was positive to weed pollen, tree pollen, dust mite, horse.  Allergen avoidance measures provided.  -food allergy  testing on 08/11/22 was positive to celery. She noticed no difference since avoiding celery.  -recommend you continue to avoid products positive on previous patch testing -Keep upcoming appointment for NAC 80 patch testing this coming Monday.  No steroids four weeks prior to this appointment.  The patch will be placed on a Monday and will be read on a Wednesday and Friday. She reports that previous patch testing 3-4 years ago was positive to Nickel and she feels like it is a food that causes her dermatitis of eye lids. Information sheet given on nickel allergy  and how certain foods that have high concentration of nickel in them can cause dermatitis. Discussed that I did not want her to avoid eating all these foods. She verbalizes understanding. - OTC antihistamine as needed -can continue Flonase  2 sprays each nostril daily for 1-2 weeks at a time before stopping once nasal congestion improves for maximum benefit. In the right nostril, point the applicator out toward the right ear. In the left nostril, point the applicator out toward the left ear -Let us  know the name of the over counter eye drop you are using for itchy watery eyes -can use Desonide  twice a day as needed for flared areas (red, irritated, dry, itchy, patchy, scaly, flaky) only.  This is a steroid.  -Continue tacrolimus as prescribed by dermatology twice a day needed for flared areas (red, irritated, dry, itchy, patchy, scaly, flaky) only.  This is a non-steroid ointment. -continue moisturization daily at least with your vanicream -the oral allergy  syndrome (OAS) or pollen-food allergy  syndrome (PFAS) is a relatively common form of food allergy , particularly in adults. It typically occurs in  people who have pollen allergies when the immune system "sees" proteins on the food that look like proteins on the pollen. This results in the allergy  antibody (IgE) binding to the food instead of the pollen. Patients typically report itching and/or mild swelling of the mouth and throat immediately following ingestion of certain uncooked fruits (including nuts) or raw vegetables. Only a very small number of affected individuals experience systemic allergic reactions, such as anaphylaxis which occurs with true food allergies.    - we have discussed the following in regards to foods:   Allergy : food allergy  is when you have eaten a food, developed an allergic reaction after eating the food and have IgE to the food (positive food testing either by skin testing or blood testing).  Food allergy  could lead to life threatening symptoms  Sensitivity: occurs when you have IgE to a food (positive food testing either by skin testing or blood testing) but is a food you eat without any issues.  This is not an allergy  and we recommend keeping the food in the diet  Intolerance: this is when you have negative testing by either skin testing or blood testing thus not allergic but the food causes symptoms (like belly pain, bloating, diarrhea etc) with ingestion.  These foods should be avoided to prevent symptoms.    Asthma-better control Continue Flovent  (fluticasone ) 110 mcg 2 puffs twice a day with spacer to help prevent cough and wheeze. Rinse mouth out after. Spacer given along with demonstration May use albuterol  2 puffs every 4-6 hours as needed for cough, wheeze, tightness in chest, or shortness of breath  Asthma control goals:  Full participation in all desired activities (may need albuterol  before activity) Albuterol  use two time or less a week on average (not counting use with activity) Cough interfering with sleep two time or less a month Oral steroids no more than once a year No  hospitalizations  Follow-up on 05/28/24 2 9:30 AM for NAC 80 patch test placement

## 2024-05-27 NOTE — Progress Notes (Unsigned)
 Follow-up Note  RE: Kaitlyn Gallagher MRN: [id number] DOB: 07-Aug-1971 Date of Office Visit: 05/28/2024  Primary care provider: Ezell Hollow, MD Referring provider: Ezell Hollow, MD   [name] returns to the office today for the patch test placement, given suspected history of contact dermatitis. All questions answered regarding care of patches.    Diagnostics: NAC 80 patches placed (written copy provided)  Plan:   Allergic contact dermatitis - Instructions provided on care of the patches for the next 48 hours. - [name] was instructed to avoid showering for the next 48 hours. - [name] will follow up in 48 hours and 96 hours   Call the clinic if this treatment plan is not working well for you  Follow up in 2 days or sooner if needed.

## 2024-05-28 ENCOUNTER — Ambulatory Visit: Admitting: Family Medicine

## 2024-05-28 ENCOUNTER — Encounter: Payer: Self-pay | Admitting: Family Medicine

## 2024-05-28 DIAGNOSIS — L235 Allergic contact dermatitis due to other chemical products: Secondary | ICD-10-CM

## 2024-05-28 NOTE — Patient Instructions (Addendum)
 Diagnostics: NAC 80 patches placed (written copy provided)  Plan:   Allergic contact dermatitis - Instructions provided on care of the patches for the next 48 hours. - [name] was instructed to avoid showering for the next 48 hours. - Kaitlyn Gallagher will follow up in 48 hours, 96 hours and 7 days for patch readings.    Call the clinic if this treatment plan is not working well for you  Follow up in 2 days or sooner if needed.

## 2024-05-29 NOTE — Progress Notes (Addendum)
 Follow Up Note  RE: Kaitlyn Gallagher MRN: [id number] DOB: 1971/11/22 Date of Office Visit: 05/30/2024  Referring provider: Ezell Hollow, MD Primary care provider: Ezell Hollow, MD  History of Present Illness: I had the pleasure of seeing Kaitlyn Gallagher for a [facility]. She is a 53 y.o. female, who is being followed for dermatitis. Today she is here for initial patch test interpretation, given suspected history of contact dermatitis.   Diagnostics:  NAC 80 48 hour reading:   NAC-80 - 05/30/24 1634     NAC Panel Tested NAC-80 (1-80)    1. Ammonium persulfate Negative    2. [place] Balsam Negative    3. BENZISOTHIAZOLINONE --   OMITTED   4. 4-tert-Butylphenolformaldehyde resin (PTBP) Negative    5. Bacitracin Negative    6. Budesonide Negative    7. Quaternium-15 Negative    8. Cinnamal Negative    9. Cobalt(II) chloride hexahydrate Negative    10. Colophonium Negative    11. Methyldibromo glutaronitrile Negative    12. Decyl Glucoside Negative    13. Ethylenediamine dihydrochloride Negative    14. 2-Hydroxyethyl methacrylate Negative    15. Hydroperoxides of Linalool Negative    16. Iodopropynyl butylcarbamate Negative    17. 2-Mercaptobenzothiazole (MBT) Negative    18. Thiuram mix Negative    19. METHYLISOTHIAZOLINONE Negative    20. Propylene glycol Negative    21. 1,3-Diphenylguanidine Negative    22. Hydroperoxides of Limonene Negative    23. Black rubber mix Negative    24. Carba mix Negative    25. Fragrance mix I Negative    26. Fragrance mix II Negative    27. Textile dye mix II Negative    28. Neomycin sulfate Negative    29. Nickel(II) sulfate hexahydrate Negative    30. p-Phenylenediamine (PPD) Negative    31. Potassium dichromate 1+    32. Propolis Negative    33. Sodium Metabisulfite Negative    34. Tixocortol-21-pivalate 1+    35. Lanolin alcohol Negative    36. Methylisothiazolinone +  Methylchloroisothiazolinone +/-    37. Cocamidopropyl betaine Negative    38. 3-(Dimethylamino)-1-propylamine Negative    39. Formaldehyde 2+    40. Oleamidopropyl dimethylamine Negative    41. 2-Bromo-2-Nitropropane-l,3-diol Negative    42. Diazolidinyl urea Negative    43. DMDM Hydantoin Negative    44. Epoxy resin, Bisphenol A Negative    45. Benzophenone-4 Negative    46. Imidazolidinyl urea Negative    47. Lauryl polyglucose Negative    48 Methyl methacrylate Negative    49. Paraben mix Negative    50. Mercapto mix Negative    51. Caine mix III Negative    52. Mixed dialkyl thiourea Negative    53. Compositae mix II Negative    54. Toluenesulfonamide formaldehyde resin Negative    55. Tea Tree Oil oxidized Negative    56. Ylang-Ylang oil Negative    57. Amidoamine Negative    58. Amerchol L 101 Negative    59. Benzocaine Negative    60. Benzyl alchohol Negative    61. Benzyl salicylate Negative    62. Chloroxylenol (PCMX) Negative    63. Cocamide DEA Negative    64. Clobetasol-17-propionate Negative    65. Toluene-2,5-Diamine sulfate Negative    66. Ethyl acrylate Negative    67. N-Isopropyl-N-phenyl--4-phenylenediamine (IPPD) Negative    68. Lidocaine Negative    69. Hydroxyisohexyl 3-Cyclohexene Carboxaldehyde --  OMITTED   70. Sesquiterpene lactone mix Negative    71. 2-n-Octyl-4-isothiazolin-3-one Negative    72. Propyl gallate Negative    73. Polymyxin B sulfate Negative    74. Pramoxine hydrochloride Negative    75. Sodium benzoate Negative    76. Sorbitan oleate Negative    77. Sorbitan sesquioleate Negative    78. Tocopherol Negative    79. BENZALKONIUM CHLORIDE Negative    80. Chlorhexidine digluconate Negative              Assessment and Plan: Kaitlyn Gallagher is a 53 y.o. female with: There are no diagnoses linked to this encounter. ADDENDUM: Patches were misread by 10 on the first reading. This is the CORRECT reading and flowsheet was corrected.   Also sent a mychart message to patient regarding this.  21. 1,3-Diphenylguanidine +1 24. Tixocortol-21-pivalate +1 26. Fragrance mix II +2 29. Nickel(II) sulfate hexahydrate +/-  Incorrect reading: +1 Potassium dichromate +1 Tixocortol-21-pivalate +2 Formaldehyde +/- Methylisothiazolinone + Methylchloroisothiazolinone The patient has been provided detailed information regarding the substances she is sensitive to, as well as products containing the substances.  Meticulous avoidance of these substances is recommended.   Return in about 2 days (around 06/01/2024) for Patch reading.  It was my pleasure to see Kaitlyn Gallagher today and participate in her care. Please feel free to contact me with any questions or concerns.  Sincerely,  Eudelia Hero, DO Allergy  & Immunology  [facility]  [facility] office: [phone] [facility] office: [phone] [place] office: [phone]

## 2024-05-30 ENCOUNTER — Encounter: Payer: Self-pay | Admitting: Allergy

## 2024-05-30 ENCOUNTER — Ambulatory Visit (INDEPENDENT_AMBULATORY_CARE_PROVIDER_SITE_OTHER): Admitting: Allergy

## 2024-05-30 ENCOUNTER — Encounter: Admitting: Allergy

## 2024-05-30 DIAGNOSIS — L2389 Allergic contact dermatitis due to other agents: Secondary | ICD-10-CM

## 2024-06-01 ENCOUNTER — Encounter: Payer: Self-pay | Admitting: Family Medicine

## 2024-06-01 ENCOUNTER — Ambulatory Visit (INDEPENDENT_AMBULATORY_CARE_PROVIDER_SITE_OTHER): Admitting: Family Medicine

## 2024-06-01 DIAGNOSIS — L2389 Allergic contact dermatitis due to other agents: Secondary | ICD-10-CM | POA: Insufficient documentation

## 2024-06-01 NOTE — Patient Instructions (Signed)
 [place] returns to the office today for the final patch test interpretation, given suspected history of contact dermatitis.    Diagnostics:  NAC Panel Tested NAC-80 (1-80)      1. Ammonium persulfate Negative     2. [place] Balsam Negative     3. BENZISOTHIAZOLINONE --   OMITTED    4. 4-tert-Butylphenolformaldehyde resin (PTBP) Negative     5. Bacitracin Negative     6. Budesonide Negative     7. Quaternium-15 Negative     8. Cinnamal Negative     9. Cobalt(II) chloride hexahydrate Negative     10. Colophonium Negative     11. Methyldibromo glutaronitrile Negative     12. Decyl Glucoside Negative     13. Ethylenediamine dihydrochloride Negative     14. 2-Hydroxyethyl methacrylate Negative     15. Hydroperoxides of Linalool Negative     16. Iodopropynyl butylcarbamate Negative     17. 2-Mercaptobenzothiazole (MBT) Negative     18. Thiuram mix Negative     19. METHYLISOTHIAZOLINONE 2+    20. Propylene glycol Negative     21. 1,3-Diphenylguanidine  2+    22. Hydroperoxides of Limonene Negative     23. Black rubber mix Negative     24. Carba mix  3+    25. Fragrance mix I Negative     26. Fragrance mix II  2+    27. Textile dye mix II Negative     28. Neomycin sulfate Negative     29. Nickel(II) sulfate hexahydrate  4+    30. p-Phenylenediamine (PPD) Negative     31. Potassium dichromate Negative    32. Propolis Negative     33. Sodium Metabisulfite Negative     34. Tixocortol-21-pivalate Negative    35. Lanolin alcohol Negative     36. Methylisothiazolinone + Methylchloroisothiazolinone Negative    37. Cocamidopropyl betaine Negative     38. 3-(Dimethylamino)-1-propylamine Negative     39. Formaldehyde Negative    40. Oleamidopropyl dimethylamine Negative     41. 2-Bromo-2-Nitropropane-l,3-diol Negative     42. Diazolidinyl urea Negative     43. DMDM Hydantoin Negative     44. Epoxy resin, Bisphenol A Negative     45. Benzophenone-4 Negative     46. Imidazolidinyl urea  Negative     47. Lauryl polyglucose Negative     48 Methyl methacrylate Negative     49. Paraben mix Negative     50. Mercapto mix Negative     51. Caine mix III Negative     52. Mixed dialkyl thiourea Negative     53. Compositae mix II Negative     54. Toluenesulfonamide formaldehyde resin Negative     55. Tea Tree Oil oxidized Negative     56. Ylang-Ylang oil Negative     57. Amidoamine Negative     58. Amerchol L 101 Negative     59. Benzocaine Negative     60. Benzyl alchohol Negative     61. Benzyl salicylate Negative     62. Chloroxylenol (PCMX) Negative     63. Cocamide DEA Negative     64. Clobetasol-17-propionate Negative     65. Toluene-2,5-Diamine sulfate Negative     66. Ethyl acrylate Negative     67. N-Isopropyl-N-phenyl--4-phenylenediamine (IPPD) Negative     68. Lidocaine Negative     69. Hydroxyisohexyl 3-Cyclohexene Carboxaldehyde --   OMITTED    70. Sesquiterpene lactone mix Negative     71. 2-n-Octyl-4-isothiazolin-3-one Negative  72. Propyl gallate Negative     73. Polymyxin B sulfate Negative     74. Pramoxine hydrochloride Negative     75. Sodium benzoate Negative     76. Sorbitan oleate Negative     77. Sorbitan sesquioleate Negative     78. Tocopherol Negative     79. BENZALKONIUM CHLORIDE Negative     80. Chlorhexidine digluconate Negative     Plan:   Allergic contact dermatitis - The patient has been provided detailed information regarding the substances she is sensitive to, as well as products containing the substances.   - Meticulous avoidance of these substances is recommended.  - If avoidance is not possible, the use of barrier creams or lotions is recommended. - If symptoms persist or progress despite meticulous avoidance of the substances as listed above, a dermatology referral may be warranted. - The sensitivity of patch testing can range from 60-80% and therefore false negative results are possible. Therefore, this does not  definitively rule out contact dermatitis.  - An email will be sent with products that do not contain the substances you were positive to

## 2024-06-01 NOTE — Progress Notes (Signed)
 Follow-up Note  RE: Kaitlyn Gallagher MRN: [id number] DOB: 1971/12/02 Date of Office Visit: 06/01/2024  Primary care provider: Ezell Hollow, MD Referring provider: Ezell Hollow, MD   Kaitlyn Gallagher returns to the office today for the final patch test interpretation, given suspected history of contact dermatitis.  We discussed the chemicals that were positive on patch testing and avoidance measures.  We discussed cetirizine as needed as well as desonide  as needed.  I will send an email detailing products that do not contain the chemicals that you were positive to on patch testing.   Diagnostics:  NAC Panel Tested NAC-80 (1-80)      1. Ammonium persulfate Negative     2. [place] Balsam Negative     3. BENZISOTHIAZOLINONE --   OMITTED    4. 4-tert-Butylphenolformaldehyde resin (PTBP) Negative     5. Bacitracin Negative     6. Budesonide Negative     7. Quaternium-15 Negative     8. Cinnamal Negative     9. Cobalt(II) chloride hexahydrate Negative     10. Colophonium Negative     11. Methyldibromo glutaronitrile Negative     12. Decyl Glucoside Negative     13. Ethylenediamine dihydrochloride Negative     14. 2-Hydroxyethyl methacrylate Negative     15. Hydroperoxides of Linalool Negative     16. Iodopropynyl butylcarbamate Negative     17. 2-Mercaptobenzothiazole (MBT) Negative     18. Thiuram mix Negative     19. METHYLISOTHIAZOLINONE 2+    20. Propylene glycol Negative     21. 1,3-Diphenylguanidine  2+    22. Hydroperoxides of Limonene Negative     23. Black rubber mix Negative     24. Carba mix  3+    25. Fragrance mix I Negative     26. Fragrance mix II  2+    27. Textile dye mix II Negative     28. Neomycin sulfate Negative     29. Nickel(II) sulfate hexahydrate  4+    30. p-Phenylenediamine (PPD) Negative     31. Potassium dichromate Negative    32. Propolis Negative     33. Sodium Metabisulfite Negative     34. Tixocortol-21-pivalate Negative    35. Lanolin alcohol  Negative     36. Methylisothiazolinone + Methylchloroisothiazolinone Negative    37. Cocamidopropyl betaine Negative     38. 3-(Dimethylamino)-1-propylamine Negative     39. Formaldehyde Negative    40. Oleamidopropyl dimethylamine Negative     41. 2-Bromo-2-Nitropropane-l,3-diol Negative     42. Diazolidinyl urea Negative     43. DMDM Hydantoin Negative     44. Epoxy resin, Bisphenol A Negative     45. Benzophenone-4 Negative     46. Imidazolidinyl urea Negative     47. Lauryl polyglucose Negative     48 Methyl methacrylate Negative     49. Paraben mix Negative     50. Mercapto mix Negative     51. Caine mix III Negative     52. Mixed dialkyl thiourea Negative     53. Compositae mix II Negative     54. Toluenesulfonamide formaldehyde resin Negative     55. Tea Tree Oil oxidized Negative     56. Ylang-Ylang oil Negative     57. Amidoamine Negative     58. Amerchol L 101 Negative     59. Benzocaine Negative     60. Benzyl alchohol Negative     61. Benzyl salicylate Negative  62. Chloroxylenol (PCMX) Negative     63. Cocamide DEA Negative     64. Clobetasol-17-propionate Negative     65. Toluene-2,5-Diamine sulfate Negative     66. Ethyl acrylate Negative     67. N-Isopropyl-N-phenyl--4-phenylenediamine (IPPD) Negative     68. Lidocaine Negative     69. Hydroxyisohexyl 3-Cyclohexene Carboxaldehyde --   OMITTED    70. Sesquiterpene lactone mix Negative     71. 2-n-Octyl-4-isothiazolin-3-one Negative     72. Propyl gallate Negative     73. Polymyxin B sulfate Negative     74. Pramoxine hydrochloride Negative     75. Sodium benzoate Negative     76. Sorbitan oleate Negative     77. Sorbitan sesquioleate Negative     78. Tocopherol Negative     79. BENZALKONIUM CHLORIDE Negative     80. Chlorhexidine digluconate Negative     Plan:   Allergic contact dermatitis - The patient has been provided detailed information regarding the substances she is sensitive to, as well as  products containing the substances.   - Meticulous avoidance of these substances is recommended.  - If avoidance is not possible, the use of barrier creams or lotions is recommended. - If symptoms persist or progress despite meticulous avoidance of the substances as listed above, a dermatology referral may be warranted. - The sensitivity of patch testing can range from 60-80% and therefore false negative results are possible. Therefore, this does not definitively rule out contact dermatitis.  - An email will be sent with products that do not contain the substances you were positive to   Thank you for the opportunity to care for this patient.  Please do not hesitate to contact me with questions.  Marinus Sic, FNP [facility]  [facility]

## 2024-06-04 ENCOUNTER — Encounter: Payer: Self-pay | Admitting: Allergy

## 2024-06-08 ENCOUNTER — Encounter: Payer: Self-pay | Admitting: Family Medicine

## 2024-06-11 NOTE — Telephone Encounter (Signed)
 Resent to both emails. Please have her notify me if she does not receive either one. We have been having an issue with this. Thank you

## 2024-07-06 ENCOUNTER — Other Ambulatory Visit: Payer: Self-pay | Admitting: Obstetrics and Gynecology

## 2024-07-06 DIAGNOSIS — Z1231 Encounter for screening mammogram for malignant neoplasm of breast: Secondary | ICD-10-CM

## 2024-07-19 ENCOUNTER — Ambulatory Visit
Admission: RE | Admit: 2024-07-19 | Discharge: 2024-07-19 | Disposition: A | Discharge status: Home / Self Care | Source: Ambulatory Visit | Attending: Obstetrics and Gynecology | Admitting: Obstetrics and Gynecology

## 2024-07-19 DIAGNOSIS — Z1231 Encounter for screening mammogram for malignant neoplasm of breast: Secondary | ICD-10-CM

## 2024-07-20 ENCOUNTER — Encounter: Payer: BC Managed Care – PPO | Admitting: Internal Medicine

## 2024-07-20 ENCOUNTER — Other Ambulatory Visit: Payer: Self-pay | Admitting: Obstetrics and Gynecology

## 2024-07-20 DIAGNOSIS — Z9189 Other specified personal risk factors, not elsewhere classified: Secondary | ICD-10-CM

## 2024-07-30 ENCOUNTER — Ambulatory Visit (INDEPENDENT_AMBULATORY_CARE_PROVIDER_SITE_OTHER): Admitting: Internal Medicine

## 2024-07-30 ENCOUNTER — Encounter: Payer: Self-pay | Admitting: Internal Medicine

## 2024-07-30 VITALS — BP 110/62 | HR 68 | Temp 98.6°F | Resp 16 | Ht 69.0 in | Wt 151.6 lb

## 2024-07-30 DIAGNOSIS — Z0000 Encounter for general adult medical examination without abnormal findings: Secondary | ICD-10-CM | POA: Diagnosis not present

## 2024-07-30 NOTE — Assessment & Plan Note (Signed)
 Here for CPX - Td: 06/2018 -Vaccines: We talked about Shingrix, PNM 20, COVID booster and a flu shot.  She is inclined to proceed with the flu and COVID booster. - Female care: Per gynecology, [facility] 06/2024, K PN.  -D/t FH breast ca reports she had a genetic testing and was told she is high risk for breast ca, was recommended: Breast MRI q 2 years, last MRI 2023 -CCS: + FH colon ca, father age at age 51.  (-) Hemoccults 2019.  s/p cscope 3/ 2021, Dr Kristie, no polyps, 5 years per report.  Patient aware. -Labs: CMP FLP CBC vitamin D  -Diet and exercise: Doing great

## 2024-07-30 NOTE — Progress Notes (Signed)
 Subjective:    Patient ID: Kaitlyn Gallagher, female    DOB: 1971-09-20, 53 y.o.   MRN: [id number]  DOS:  07/30/2024 Type of visit - description: CPX  Here for CPX. Having skin allergies for months, she is finally getting better. She feels the skin in the ears is irritated, she is scratched the area and gets some dry skin.  Review of Systems  Other than above, a 14 point review of systems is negative     Past Medical History:  Diagnosis Date   Asthma    Back pain    GERD (gastroesophageal reflux disease)    Paresthesia    Seasonal allergies     Past Surgical History:  Procedure Laterality Date   BREAST BIOPSY Right 2019   uterine polyp removal       Current Outpatient Medications  Medication Instructions   albuterol  (VENTOLIN  HFA) 108 (90 Base) MCG/ACT inhaler 2 puffs, Inhalation, Every 6 hours PRN   desonide  (DESOWEN ) 0.05 % cream Use 1 application twice a day as needed to red itchy areas. This is safe to use on the face. Do not use longer than 7 to 10 days in a row.   fluticasone  (FLOVENT  HFA) 110 MCG/ACT inhaler Inhale 2 puffs twice a day with spacer to help prevent cough and wheeze. Rinse mouth out after   levocetirizine (XYZAL ) 5 mg, Every evening   sertraline  (ZOLOFT ) 75 mg, Oral, Daily   tacrolimus (PROTOPIC) 0.03 % ointment 2 times daily       Objective:   Physical Exam BP 110/62 (BP Location: Left Arm, Patient Position: Sitting, Cuff Size: Normal)   Pulse 68   Temp 98.6 F (37 C) (Oral)   Resp 16   Ht 5' 9 (1.753 m)   Wt 151 lb 9.6 oz (68.8 kg)   LMP 02/18/2024   SpO2 99%   BMI 22.39 kg/m  General: Well developed, NAD, BMI noted Neck: No  thyromegaly  HEENT:  Normocephalic . Face symmetric, atraumatic Ears: Essentially normal, on the right side there is a  minute area of mild scaliness at the entrance of the canal. Lungs:  CTA B Normal respiratory effort, no intercostal retractions, no accessory muscle use. Heart: RRR,  no murmur.   Abdomen:  Not distended, soft, non-tender. No rebound or rigidity.   Lower extremities: no pretibial edema bilaterally  Skin: Exposed areas without rash. Not pale. Not jaundice Neurologic:  alert & oriented X3.  Speech normal, gait appropriate for age and unassisted Strength symmetric and appropriate for age.  Psych: Cognition and judgment appear intact.  Cooperative with normal attention span and concentration.  Behavior appropriate. No anxious or depressed appearing.     Assessment       Assessment Allergic rhinitis, atopic dermatitis Asthma  GERD (h/o GI eval ~ 2005, patient reports she was diagnosed with dysmotility?) LMP 02/2024   PLAN Here for CPX - Td: 06/2018 -Vaccines: We talked about Shingrix, PNM 20, COVID booster and a flu shot.  She is inclined to proceed with the flu and COVID booster. - Female care: Per gynecology, MMG 06/2024, K PN.  -D/t FH breast ca reports she had a genetic testing and was told she is high risk for breast ca, was recommended: Breast MRI q 2 years, last MRI 2023 -CCS: + FH colon ca, father age at age 60.  (-) Hemoccults 2019.  s/p cscope 3/ 2021, Dr Kristie, no polyps, 5 years per report.  Patient aware. -Labs: CMP FLP CBC  vitamin D  -Diet and exercise: Doing great   Other issues: Allergic, contact dermatitis.  Rash around the eyes follow-up by her allergist.  On topical tracolimus and prn steroids.  Has noted significant improvement however she thinks is related to a change in diet and  herbal supplements. Asthma: Allergist  recommended Flovent  daily and Ventolin  as needed. Anxiety: On sertraline , controlled.  No change.  RF as needed. Palpitations: Resolved RTC CPX 1 year

## 2024-07-30 NOTE — Assessment & Plan Note (Signed)
 Here for CPX   Other issues: Allergic, contact dermatitis.  Rash around the eyes follow-up by her allergist.  On topical tracolimus and prn steroids.  Has noted significant improvement however she thinks is related to a change in diet and  herbal supplements. Asthma: Allergist  recommended Flovent  daily and Ventolin  as needed. Anxiety: On sertraline , controlled.  No change.  RF as needed. Palpitations: Resolved RTC CPX 1 year

## 2024-07-30 NOTE — Patient Instructions (Addendum)
 Recommend flu and a COVID-vaccine this fall  GO TO THE LAB :  Get the blood work   Your results will be posted on MyChart with my comments  Next office visit for a physical exam in 1 year Please make an appointment before you leave today

## 2024-07-31 LAB — LIPID PANEL
Cholesterol: 243 mg/dL — ABNORMAL HIGH (ref 0–200)
HDL: 73.8 mg/dL (ref >39.00)
LDL Cholesterol: 149 mg/dL — ABNORMAL HIGH (ref 0–99)
NonHDL: 168.87
Total CHOL/HDL Ratio: 3
Triglycerides: 98 mg/dL (ref 0.0–149.0)
VLDL: 19.6 mg/dL (ref 0.0–40.0)

## 2024-07-31 LAB — CBC WITH DIFFERENTIAL/PLATELET
Basophils Absolute: 0 K/uL (ref 0.0–0.1)
Basophils Relative: 0.6 % (ref 0.0–3.0)
Eosinophils Absolute: 0.8 K/uL — ABNORMAL HIGH (ref 0.0–0.7)
Eosinophils Relative: 13.7 % — ABNORMAL HIGH (ref 0.0–5.0)
HCT: 40.2 % (ref 36.0–46.0)
Hemoglobin: 13.6 g/dL (ref 12.0–15.0)
Lymphocytes Relative: 29.2 % (ref 12.0–46.0)
Lymphs Abs: 1.7 K/uL (ref 0.7–4.0)
MCHC: 33.9 g/dL (ref 30.0–36.0)
MCV: 89.7 fl (ref 78.0–100.0)
Monocytes Absolute: 0.5 K/uL (ref 0.1–1.0)
Monocytes Relative: 8.9 % (ref 3.0–12.0)
Neutro Abs: 2.8 K/uL (ref 1.4–7.7)
Neutrophils Relative %: 47.6 % (ref 43.0–77.0)
Platelets: 324 K/uL (ref 150.0–400.0)
RBC: 4.48 Mil/uL (ref 3.87–5.11)
RDW: 12.4 % (ref 11.5–15.5)
WBC: 5.9 K/uL (ref 4.0–10.5)

## 2024-07-31 LAB — COMPREHENSIVE METABOLIC PANEL WITH GFR
ALT: 6 U/L (ref 0–35)
AST: 16 U/L (ref 0–37)
Albumin: 4.3 g/dL (ref 3.5–5.2)
Alkaline Phosphatase: 39 U/L (ref 39–117)
BUN: 19 mg/dL (ref 6–23)
CO2: 30 meq/L (ref 19–32)
Calcium: 9.3 mg/dL (ref 8.4–10.5)
Chloride: 101 meq/L (ref 96–112)
Creatinine, Ser: 0.74 mg/dL (ref 0.40–1.20)
GFR: 93.92 mL/min (ref >60.00)
Glucose, Bld: 70 mg/dL (ref 70–99)
Potassium: 4.1 meq/L (ref 3.5–5.1)
Sodium: 139 meq/L (ref 135–145)
Total Bilirubin: 0.3 mg/dL (ref 0.2–1.2)
Total Protein: 6.9 g/dL (ref 6.0–8.3)

## 2024-07-31 LAB — VITAMIN D 25 HYDROXY (VIT D DEFICIENCY, FRACTURES): VITD: 32.25 ng/mL (ref 30.00–100.00)

## 2024-08-02 ENCOUNTER — Ambulatory Visit: Payer: Self-pay | Admitting: Internal Medicine

## 2024-08-23 ENCOUNTER — Encounter: Payer: Self-pay | Admitting: Internal Medicine

## 2024-08-23 DIAGNOSIS — E785 Hyperlipidemia, unspecified: Secondary | ICD-10-CM

## 2024-08-25 ENCOUNTER — Other Ambulatory Visit

## 2024-09-04 NOTE — Telephone Encounter (Signed)
 Order placed

## 2024-09-04 NOTE — Telephone Encounter (Signed)
 Amon Aloysius BRAVO, MD to Me (Selected Message)     09/04/24 10:43 AM Arrange a nutritionist referral if so desired. FLP in 6 months, Dx dyslipidemia

## 2024-09-07 NOTE — Addendum Note (Signed)
 Addended by: Shiraz Bastyr D on: 09/07/2024 07:57 AM   Modules accepted: Orders

## 2024-09-29 ENCOUNTER — Ambulatory Visit
Admission: RE | Admit: 2024-09-29 | Discharge: 2024-09-29 | Disposition: A | Discharge status: Home / Self Care | Source: Ambulatory Visit | Attending: Obstetrics and Gynecology | Admitting: Obstetrics and Gynecology

## 2024-09-29 DIAGNOSIS — Z9189 Other specified personal risk factors, not elsewhere classified: Secondary | ICD-10-CM

## 2024-09-29 MED ORDER — GADOPICLENOL 0.5 MMOL/ML IV SOLN
7.0000 mL | Freq: Once | INTRAVENOUS | Status: AC | PRN
  Administered 2024-09-29: 7 mL via INTRAVENOUS

## 2024-10-25 ENCOUNTER — Ambulatory Visit: Admitting: Dietician

## 2024-12-22 ENCOUNTER — Other Ambulatory Visit: Payer: Self-pay | Admitting: Family

## 2025-01-01 ENCOUNTER — Other Ambulatory Visit: Payer: Self-pay | Admitting: Internal Medicine
